# Patient Record
Sex: Female | Born: 1970 | Hispanic: Yes | Marital: Married | Smoking: Never smoker
Health system: Southern US, Community
[De-identification: ages and names within clinical notes are randomized; demographics above are authoritative.]

---

## 2005-08-31 ENCOUNTER — Emergency Department: Payer: Self-pay | Admitting: Unknown Physician Specialty

## 2005-09-19 ENCOUNTER — Emergency Department: Payer: Self-pay | Admitting: General Practice

## 2009-01-18 ENCOUNTER — Emergency Department: Payer: Self-pay | Admitting: Emergency Medicine

## 2009-12-31 ENCOUNTER — Ambulatory Visit: Payer: Self-pay

## 2011-11-01 ENCOUNTER — Emergency Department: Payer: Self-pay | Admitting: Emergency Medicine

## 2012-04-04 LAB — HM PAP SMEAR

## 2013-11-22 ENCOUNTER — Emergency Department: Payer: Self-pay | Admitting: Emergency Medicine

## 2013-11-22 LAB — URINALYSIS, COMPLETE
Bilirubin,UR: NEGATIVE
Glucose,UR: NEGATIVE mg/dL (ref 0–75)
Ketone: NEGATIVE
Nitrite: NEGATIVE
Ph: 7 (ref 4.5–8.0)
Protein: NEGATIVE
Specific Gravity: 1.011 (ref 1.003–1.030)
WBC UR: 2 /HPF (ref 0–5)

## 2014-09-23 ENCOUNTER — Ambulatory Visit: Payer: Self-pay | Admitting: Obstetrics and Gynecology

## 2014-09-23 LAB — BASIC METABOLIC PANEL
Anion Gap: 5 — ABNORMAL LOW (ref 7–16)
BUN: 8 mg/dL (ref 7–18)
CALCIUM: 8.6 mg/dL (ref 8.5–10.1)
CREATININE: 0.59 mg/dL — AB (ref 0.60–1.30)
Chloride: 107 mmol/L (ref 98–107)
Co2: 27 mmol/L (ref 21–32)
EGFR (African American): >60
Glucose: 77 mg/dL (ref 65–99)
OSMOLALITY: 275 (ref 275–301)
Potassium: 4.4 mmol/L (ref 3.5–5.1)
Sodium: 139 mmol/L (ref 136–145)

## 2014-09-23 LAB — HEMOGLOBIN: HGB: 12 g/dL (ref 12.0–16.0)

## 2014-09-26 ENCOUNTER — Ambulatory Visit: Payer: Self-pay | Admitting: Obstetrics and Gynecology

## 2014-09-26 HISTORY — PX: TOTAL ABDOMINAL HYSTERECTOMY: SHX209

## 2014-09-26 HISTORY — PX: ABDOMINAL HYSTERECTOMY: SHX81

## 2014-09-27 LAB — BASIC METABOLIC PANEL
Anion Gap: 7 (ref 7–16)
BUN: 10 mg/dL (ref 7–18)
CALCIUM: 7.3 mg/dL — AB (ref 8.5–10.1)
Chloride: 109 mmol/L — ABNORMAL HIGH (ref 98–107)
Co2: 26 mmol/L (ref 21–32)
Creatinine: 0.73 mg/dL (ref 0.60–1.30)
EGFR (African American): >60
EGFR (Non-African Amer.): >60
Glucose: 92 mg/dL (ref 65–99)
Osmolality: 282 (ref 275–301)
POTASSIUM: 3.4 mmol/L — AB (ref 3.5–5.1)
Sodium: 142 mmol/L (ref 136–145)

## 2014-09-27 LAB — HEMATOCRIT: HCT: 32.9 % — AB (ref 35.0–47.0)

## 2014-09-29 LAB — PATHOLOGY REPORT

## 2015-02-24 ENCOUNTER — Ambulatory Visit: Payer: Self-pay | Admitting: Podiatry

## 2015-02-26 ENCOUNTER — Ambulatory Visit: Payer: Self-pay | Admitting: Obstetrics and Gynecology

## 2015-04-11 NOTE — Op Note (Signed)
PATIENT NAME:  Tara Bishop, Tara L MR#:  [id number] DATE OF BIRTH:  [birth year]  DATE OF PROCEDURE:  09/26/2014  PREOPERATIVE DIAGNOSIS: Menorrhagia unresponsive to conservative therapy.   POSTOPERATIVE DIAGNOSES: 1.  Menorrhagia.  2.  Fitz-Hugh-Curtis syndrome.  3.  Right simple ovarian cyst.   PROCEDURES: 1.  Laparoscopic supracervical hysterectomy.  2.  Bilateral salpingectomy. 3.  Right ovarian cystotomy.  4.  Lysis of adhesions.   SURGEON: Jennell Cornerhomas Kamarie Veno, MD  FIRST ASSISTANT: Jolinda Croakavid Goodman, MD  ANESTHESIA: General endotracheal anesthesia.   INDICATIONS: This is a 44 year old gravida 0, a patient with a long history of menorrhagia unresponsive to conservative therapy, including birth control pills. Endometrial biopsy and saline infusion sonohysterography all within normal limits.   DESCRIPTION OF PROCEDURE: After adequate general endotracheal anesthesia, the patient was placed in the dorsal supine position with the legs in the [place] stirrups. The patient's abdomen, perineum, and vagina were prepped in normal sterile fashion. After a time out was performed, the patient's bladder was catheterized with a Foley yielding clear urine. Speculum was placed in the vagina and the anterior cervix was grasped with a single-tooth tenaculum and a uterine sound was placed into the endometrial cavity and the two were anchored together with Steri-Strips. Gloves were changed and a 12 mm infraumbilical incision was made after injecting with 0.5% Marcaine. The laparoscope was advanced into the abdominal cavity under direct visualization with the Optiview cannula. A second port site left lower quadrant was placed. Again, an 11 mm port was placed 3 cm medial to the left anterior iliac spine and under direct visualization the port was placed. A third port site was placed in the right lower quadrant, again 3 cm medial to the right anterior iliac spine and under direct visualization an 11 mm trocar was  advanced into the abdominal cavity. The patient was placed in Trendelenburg and the left cornea was grasped with atraumatic grasper and the round ligament was then clamped, cauterized, and transected with the Harmonic scalpel. The proximal portion of the left fallopian tube and the utero-ovarian ligaments were clamped, transected with Harmonic scalpel. Serial clamps in the broad ligament were performed. The bladder flap was created and opened with the Harmonic scalpel. The left uterine artery was then identified and cauterized with the Kleppinger. It was then transected with the Harmonic scalpel. The cervix was partially transected at the level of the uterosacral ligaments, from the left side. Attention was then directed to the patient's right round ligament which was clamped and transected with the Harmonic scalpel, and the right proximal portion of fallopian tube and the right utero-ovarian ligament were clamped and transected with the Harmonic scalpel. Again, the right uterine artery was identified after the rest of the bladder flap was opened. The Kleppingers were used to cauterize the right uterine artery and then the artery was transected with the Harmonic scalpel. The rest of the cervix was transected at the level of the uterosacral ligaments. The uterine sound was then removed and ultimately the uterus was transected completely. Good hemostasis was noted. Surgeon then placed a vaginal hand and the endocervical canal was cauterized with the Kleppingers. Good hemostasis was noted. Gloves were changed and each fallopian tube was grasped with atraumatic grasper and were removed with the Harmonic scalpel. These will be sent with the main specimen. The right ovary did have some scar tissue encapsulating the wall of the ovary which the adhesions were taken down with the Harmonic scalpel. Given approximately 5 cm size of the ovary, it  was felt that there was an ovarian cyst within. Harmonic scalpel was used to open  the cyst wall and clear fluid resulted with good collapse of the ovarian cyst. There were some hemosiderin that was noted on the right ovary, nonspecific. The left colon was noted to be slightly scarred to the left sidewall. Harmonic scalpel was used to release these adhesions. Upper abdomen showed perihepatic stranding consistent with Fitz-Hugh-Curtis syndrome. The left lower port site was removed and the morcellator was then advanced into the abdominal cavity under direct visualization. Uterus was morcellated in standard fashion without difficulty. Morcellator was then removed from the left lower port site and the 11 mm trocar was readvanced into the abdominal cavity and the patient's abdomen was copiously irrigated. The ureters had normal peristaltic activity bilaterally. There was good hemostasis after the intra-abdominal pressure was lowered to 7 mmHg and the procedure was then terminated, all port sites removed and the patient's abdomen was deflated. The left lower port site was closed with a fascial layer of 2-0 Vicryl and all skin incisions were closed with interrupted 4-0 Vicryl suture. Dermabond was used on all the incisions and a loose dressing. The single-tooth tenaculum was then removed from the anterior cervix. Good hemostasis was noted. The Foley was left in place. There were no complications. Estimated blood loss 25 mL. Intraoperative fluids 1000 mL. The patient tolerated the procedure well. Of note, she did receive 2 grams IV cefoxitin prior to commencement of the case and she was taken to the recovery room in good condition.  ____________________________ Suzy Bouchard, MD tjs:sb D: 09/26/2014 11:23:25 ET T: 09/26/2014 11:49:53 ET JOB#: [id number]  cc: Suzy Bouchard, MD, <Dictator> Suzy Bouchard MD ELECTRONICALLY SIGNED 09/26/2014 20:28

## 2016-02-16 DIAGNOSIS — N979 Female infertility, unspecified: Secondary | ICD-10-CM | POA: Insufficient documentation

## 2016-02-17 ENCOUNTER — Ambulatory Visit (INDEPENDENT_AMBULATORY_CARE_PROVIDER_SITE_OTHER): Payer: BLUE CROSS/BLUE SHIELD | Admitting: Unknown Physician Specialty

## 2016-02-17 ENCOUNTER — Encounter: Payer: Self-pay | Admitting: Unknown Physician Specialty

## 2016-02-17 VITALS — BP 119/83 | HR 70 | Temp 98.1°F | Ht 60.5 in | Wt 180.8 lb

## 2016-02-17 DIAGNOSIS — Z0000 Encounter for general adult medical examination without abnormal findings: Secondary | ICD-10-CM

## 2016-02-17 NOTE — Progress Notes (Signed)
-    BP 119/83 mmHg  Pulse 70  Temp(Src) 98.1 F (36.7 C)  Ht 5' 0.5" (1.537 m)  Wt 180 lb 12.8 oz (82.01 kg)  BMI 34.72 kg/m2  SpO2 97%  LMP  (LMP Unknown)   Subjective:    Patient ID: Tara Bishop, female    DOB: Apr 21, 1971, 45 y.o.   MRN: [id number]  HPI: Tara Bishop is a 45 y.o. female  Chief Complaint  Patient presents with  . Annual Exam   History reviewed. No pertinent past medical history.  Past Surgical History  Procedure Laterality Date  . Abdominal hysterectomy  09/26/14    partial   Family History  Problem Relation Age of Onset  . Cancer Mother     Relevant past medical, surgical, family and social history reviewed and updated as indicated. Interim medical history since our last visit reviewed. Allergies and medications reviewed and updated.  Review of Systems  Constitutional: Negative.   HENT: Negative.   Eyes: Negative.   Respiratory: Negative.   Cardiovascular: Negative.   Gastrointestinal: Negative.   Endocrine: Negative.   Genitourinary: Negative.   Musculoskeletal: Negative.   Skin: Negative.   Allergic/Immunologic: Negative.   Neurological: Negative.   Hematological: Negative.   Psychiatric/Behavioral: Negative.    Per HPI unless specifically indicated above     Objective:    BP 119/83 mmHg  Pulse 70  Temp(Src) 98.1 F (36.7 C)  Ht 5' 0.5" (1.537 m)  Wt 180 lb 12.8 oz (82.01 kg)  BMI 34.72 kg/m2  SpO2 97%  LMP  (LMP Unknown)  Wt Readings from Last 3 Encounters:  02/17/16 180 lb 12.8 oz (82.01 kg)  03/22/13 179 lb (81.194 kg)    Physical Exam  Constitutional: She is oriented to person, place, and time. She appears well-developed and well-nourished.  HENT:  Head: Normocephalic and atraumatic.  Eyes: Pupils are equal, round, and reactive to light. Right eye exhibits no discharge. Left eye exhibits no discharge. No scleral icterus.  Neck: Normal range of motion. Neck supple. Carotid bruit is not present. No thyromegaly  present.  Cardiovascular: Normal rate, regular rhythm and normal heart sounds.  Exam reveals no gallop and no friction rub.   No murmur heard. Pulmonary/Chest: Effort normal and breath sounds normal. No respiratory distress. She has no wheezes. She has no rales.  Abdominal: Soft. Bowel sounds are normal. There is no tenderness. There is no rebound.  Genitourinary: No breast swelling, tenderness or discharge.  Musculoskeletal: Normal range of motion.  Lymphadenopathy:    She has no cervical adenopathy.  Neurological: She is alert and oriented to person, place, and time.  Skin: Skin is warm, dry and intact. No rash noted.  Psychiatric: She has a normal mood and affect. Her speech is normal and behavior is normal. Judgment and thought content normal. Cognition and memory are normal.      Assessment & Plan:   Problem List Items Addressed This Visit    None    Visit Diagnoses    Annual physical exam    -  Primary    Relevant Orders    CBC with Differential/Platelet    Comprehensive metabolic panel    HIV antibody    TSH    Lipid Panel w/o Chol/HDL Ratio        Follow up plan: No Follow-up on file.

## 2016-02-18 LAB — LIPID PANEL W/O CHOL/HDL RATIO
CHOLESTEROL TOTAL: 177 mg/dL (ref 100–199)
HDL: 41 mg/dL (ref >39)
LDL CALC: 113 mg/dL — AB (ref 0–99)
Triglycerides: 116 mg/dL (ref 0–149)
VLDL CHOLESTEROL CAL: 23 mg/dL (ref 5–40)

## 2016-02-18 LAB — CBC WITH DIFFERENTIAL/PLATELET
Basophils Absolute: 0 10*3/uL (ref 0.0–0.2)
Basos: 1 %
EOS (ABSOLUTE): 0.1 10*3/uL (ref 0.0–0.4)
Eos: 1 %
Hematocrit: 38.7 % (ref 34.0–46.6)
Hemoglobin: 13 g/dL (ref 11.1–15.9)
Immature Grans (Abs): 0 10*3/uL (ref 0.0–0.1)
Immature Granulocytes: 0 %
LYMPHS ABS: 2.5 10*3/uL (ref 0.7–3.1)
Lymphs: 28 %
MCH: 28.5 pg (ref 26.6–33.0)
MCHC: 33.6 g/dL (ref 31.5–35.7)
MCV: 85 fL (ref 79–97)
MONOS ABS: 0.5 10*3/uL (ref 0.1–0.9)
Monocytes: 6 %
Neutrophils Absolute: 5.7 10*3/uL (ref 1.4–7.0)
Neutrophils: 64 %
Platelets: 257 10*3/uL (ref 150–379)
RBC: 4.56 x10E6/uL (ref 3.77–5.28)
RDW: 14.3 % (ref 12.3–15.4)
WBC: 8.8 10*3/uL (ref 3.4–10.8)

## 2016-02-18 LAB — COMPREHENSIVE METABOLIC PANEL
A/G RATIO: 1.9 (ref 1.1–2.5)
ALK PHOS: 84 IU/L (ref 39–117)
ALT: 23 IU/L (ref 0–32)
AST: 19 IU/L (ref 0–40)
Albumin: 4.6 g/dL (ref 3.5–5.5)
BUN/Creatinine Ratio: 13 (ref 9–23)
BUN: 8 mg/dL (ref 6–24)
Bilirubin Total: 0.3 mg/dL (ref 0.0–1.2)
CO2: 25 mmol/L (ref 18–29)
Calcium: 9.3 mg/dL (ref 8.7–10.2)
Chloride: 100 mmol/L (ref 96–106)
Creatinine, Ser: 0.63 mg/dL (ref 0.57–1.00)
GFR calc Af Amer: 128 mL/min/{1.73_m2} (ref >59)
GFR, EST NON AFRICAN AMERICAN: 111 mL/min/{1.73_m2} (ref >59)
GLOBULIN, TOTAL: 2.4 g/dL (ref 1.5–4.5)
Glucose: 94 mg/dL (ref 65–99)
POTASSIUM: 4.7 mmol/L (ref 3.5–5.2)
SODIUM: 139 mmol/L (ref 134–144)
Total Protein: 7 g/dL (ref 6.0–8.5)

## 2016-02-18 LAB — HIV ANTIBODY (ROUTINE TESTING W REFLEX): HIV SCREEN 4TH GENERATION: NONREACTIVE

## 2016-02-18 LAB — TSH: TSH: 1.66 u[IU]/mL (ref 0.450–4.500)

## 2016-02-19 ENCOUNTER — Encounter: Payer: Self-pay | Admitting: Unknown Physician Specialty

## 2016-09-04 ENCOUNTER — Emergency Department
Admission: EM | Admit: 2016-09-04 | Discharge: 2016-09-04 | Disposition: A | Discharge status: Home / Self Care | Payer: BLUE CROSS/BLUE SHIELD | Attending: Emergency Medicine | Admitting: Emergency Medicine

## 2016-09-04 ENCOUNTER — Emergency Department: Payer: BLUE CROSS/BLUE SHIELD

## 2016-09-04 ENCOUNTER — Encounter: Payer: Self-pay | Admitting: Emergency Medicine

## 2016-09-04 DIAGNOSIS — R1032 Left lower quadrant pain: Secondary | ICD-10-CM | POA: Diagnosis present

## 2016-09-04 DIAGNOSIS — R109 Unspecified abdominal pain: Secondary | ICD-10-CM

## 2016-09-04 LAB — CBC
HCT: 41.5 % (ref 35.0–47.0)
Hemoglobin: 14.4 g/dL (ref 12.0–16.0)
MCH: 29.4 pg (ref 26.0–34.0)
MCHC: 34.6 g/dL (ref 32.0–36.0)
MCV: 84.8 fL (ref 80.0–100.0)
Platelets: 237 K/uL (ref 150–440)
RBC: 4.89 MIL/uL (ref 3.80–5.20)
RDW: 14.4 % (ref 11.5–14.5)
WBC: 9.8 K/uL (ref 3.6–11.0)

## 2016-09-04 LAB — COMPREHENSIVE METABOLIC PANEL WITH GFR
ALT: 24 U/L (ref 14–54)
AST: 22 U/L (ref 15–41)
Albumin: 4.6 g/dL (ref 3.5–5.0)
Alkaline Phosphatase: 66 U/L (ref 38–126)
Anion gap: 5 (ref 5–15)
BUN: 11 mg/dL (ref 6–20)
CO2: 29 mmol/L (ref 22–32)
Calcium: 9.1 mg/dL (ref 8.9–10.3)
Chloride: 106 mmol/L (ref 101–111)
Creatinine, Ser: 0.63 mg/dL (ref 0.44–1.00)
GFR calc Af Amer: >60 mL/min (ref >60)
GFR calc non Af Amer: >60 mL/min (ref >60)
Glucose, Bld: 90 mg/dL (ref 65–99)
Potassium: 4 mmol/L (ref 3.5–5.1)
Sodium: 140 mmol/L (ref 135–145)
Total Bilirubin: 0.5 mg/dL (ref 0.3–1.2)
Total Protein: 7.8 g/dL (ref 6.5–8.1)

## 2016-09-04 LAB — URINALYSIS COMPLETE WITH MICROSCOPIC (ARMC ONLY)
Bacteria, UA: NONE SEEN
Bilirubin Urine: NEGATIVE
Glucose, UA: NEGATIVE mg/dL
Hgb urine dipstick: NEGATIVE
Ketones, ur: NEGATIVE mg/dL
Leukocytes, UA: NEGATIVE
Nitrite: NEGATIVE
Protein, ur: NEGATIVE mg/dL
RBC / HPF: NONE SEEN RBC/hpf (ref 0–5)
Specific Gravity, Urine: 1.012 (ref 1.005–1.030)
WBC, UA: NONE SEEN WBC/hpf (ref 0–5)
pH: 6 (ref 5.0–8.0)

## 2016-09-04 LAB — POCT PREGNANCY, URINE: Preg Test, Ur: NEGATIVE

## 2016-09-04 LAB — LIPASE, BLOOD: Lipase: 21 U/L (ref 11–51)

## 2016-09-04 NOTE — ED Provider Notes (Addendum)
[facility] Emergency Department Provider Note  ____________________________________________   I have reviewed the triage vital signs and the nursing notes.   HISTORY  Chief Complaint Abdominal Pain    HPI Tara Bishop is a 45 y.o. female who does have primary care. Patient states that she had a partial hysterectomy several years ago. For the last month or so she has been having left lower quadrant abdominal discomfort which comes and goes.She denies any fever chills nausea or vomiting. She has no dysuria no urinary frequency. Sometimes it goes towards the left flank. She has never had kidney stones. He does have a history of ovarian cysts remotely. History is per translator and my Spanish. Pain is somewhat similar to prior ovarian cyst. Patient denies any vaginal discharge, but really has no other symptoms aside from this discomfort. Nothing makes it better nothing makes it worse aside from walking around sometimes     History reviewed. No pertinent past medical history.  There are no active problems to display for this patient.   Past Surgical History:  Procedure Laterality Date  . ABDOMINAL HYSTERECTOMY  09/26/14   partial    Prior to Admission medications   Not on File    Allergies Review of patient's allergies indicates no known allergies.  Family History  Problem Relation Age of Onset  . Cancer Mother     Social History Social History  Substance Use Topics  . Smoking status: Never Smoker  . Smokeless tobacco: Never Used  . Alcohol use No    Review of Systems Constitutional: No fever/chills Eyes: No visual changes. ENT: No sore throat. No stiff neck no neck pain Cardiovascular: Denies chest pain. Respiratory: Denies shortness of breath. Gastrointestinal:   no vomiting.  No diarrhea.  No constipation. Genitourinary: Negative for dysuria. Musculoskeletal: Negative lower extremity swelling Skin: Negative for  rash. Neurological: Negative for severe headaches, focal weakness or numbness. 10-point ROS otherwise negative.  ____________________________________________   PHYSICAL EXAM:  VITAL SIGNS: ED Triage Vitals  Enc Vitals Group     BP 09/04/16 1451 130/83     Pulse Rate 09/04/16 1451 (!) 53     Resp 09/04/16 1451 16     Temp 09/04/16 1451 98.3 F (36.8 C)     Temp Source 09/04/16 1451 Oral     SpO2 09/04/16 1451 98 %     Weight 09/04/16 1453 180 lb (81.6 kg)     Height 09/04/16 1453 5\' 2"  (1.575 m)     Head Circumference --      Peak Flow --      Pain Score 09/04/16 1454 9     Pain Loc --      Pain Edu? --      Excl. in GC? --     Constitutional: Alert and oriented. Well appearing and in no acute distress. Eyes: Conjunctivae are normal. PERRL. EOMI. Head: Atraumatic. Nose: No congestion/rhinnorhea. Mouth/Throat: Mucous membranes are moist.  Oropharynx non-erythematous. Neck: No stridor.   Nontender with no meningismus Cardiovascular: Normal rate, regular rhythm. Grossly normal heart sounds.  Good peripheral circulation. Respiratory: Normal respiratory effort.  No retractions. Lungs CTAB. Abdominal: Soft and Minimal tenders palpations specific spot on the left lower quadrant. Obesity limits the exam somewhat but there is no evidence of obvious hernia mass or infection. No distention. No guarding no rebound no peritoneal signs Back:  There is no focal tenderness or step off.  there is no midline tenderness there are no lesions noted. there  is no CVA tenderness Musculoskeletal: No lower extremity tenderness, no upper extremity tenderness. No joint effusions, no DVT signs strong distal pulses no edema Neurologic:  Normal speech and language. No gross focal neurologic deficits are appreciated.  Skin:  Skin is warm, dry and intact. No rash noted. Psychiatric: Mood and affect are normal. Speech and behavior are normal.  ____________________________________________   LABS (all labs  ordered are listed, but only abnormal results are displayed)  Labs Reviewed  URINALYSIS COMPLETEWITH MICROSCOPIC ([facility] ONLY) - Abnormal; Notable for the following:       Result Value   Color, Urine YELLOW (*)    APPearance CLEAR (*)    Squamous Epithelial / LPF 0-5 (*)    All other components within normal limits  LIPASE, BLOOD  COMPREHENSIVE METABOLIC PANEL  CBC   ____________________________________________  EKG  I personally interpreted any EKGs ordered by me or triage  ____________________________________________  RADIOLOGY  I reviewed any imaging ordered by me or triage that were performed during my shift and, if possible, patient and/or family made aware of any abnormal findings. ____________________________________________   PROCEDURES  Procedure(s) performed: None  Procedures  Critical Care performed: None  ____________________________________________   INITIAL IMPRESSION / ASSESSMENT AND PLAN / ED COURSE  Pertinent labs & imaging results that were available during my care of the patient were reviewed by me and considered in my medical decision making (see chart for details).  Very atypical left-sided abdominal pain differential does include diverticulitis kidney stone and ovarian cyst given history of ovarian cysts and the fact that she has ovaries still we did do an ultrasound. That is negative for any likely contributing pathology there is an ovarian cyst on the right. Obesity somewhat limited exam with 3 weeks of discomfort with no acute findings and quite reassured. However, a obstructing kidney stone could certainly cause similar symptoms as he can, less likely, diverticulitis. I will obtain CT scan this patient states she has had great difficulty getting in to see her primary care provider secondary to language barrier. Internal hernia is also possible Given obesity.  Clinical Course   ____________________________________________   FINAL CLINICAL  IMPRESSION(S) / ED DIAGNOSES  Final diagnoses:  Abdominal pain      This chart was dictated using voice recognition software.  Despite best efforts to proofread,  errors can occur which can change meaning.      Jeanmarie Plant, MD 09/04/16 1727    Jeanmarie Plant, MD 09/04/16 [phone]

## 2016-09-04 NOTE — ED Notes (Signed)
AAOx3.  Skin warm and dry. Posture upright and relaxed.  NAD 

## 2017-02-27 ENCOUNTER — Encounter: Payer: BLUE CROSS/BLUE SHIELD | Admitting: Unknown Physician Specialty

## 2017-03-17 ENCOUNTER — Encounter: Payer: Self-pay | Admitting: Unknown Physician Specialty

## 2017-03-17 ENCOUNTER — Ambulatory Visit (INDEPENDENT_AMBULATORY_CARE_PROVIDER_SITE_OTHER): Payer: BLUE CROSS/BLUE SHIELD | Admitting: Unknown Physician Specialty

## 2017-03-17 VITALS — BP 119/85 | HR 65 | Temp 98.3°F | Ht 60.7 in | Wt 185.3 lb

## 2017-03-17 DIAGNOSIS — E6609 Other obesity due to excess calories: Secondary | ICD-10-CM | POA: Diagnosis not present

## 2017-03-17 DIAGNOSIS — Z6835 Body mass index (BMI) 35.0-35.9, adult: Secondary | ICD-10-CM | POA: Diagnosis not present

## 2017-03-17 DIAGNOSIS — Z0000 Encounter for general adult medical examination without abnormal findings: Secondary | ICD-10-CM | POA: Diagnosis not present

## 2017-03-17 NOTE — Progress Notes (Signed)
BP 119/85 (BP Location: Left Arm, Patient Position: Sitting, Cuff Size: Normal)   Pulse 65   Temp 98.3 F (36.8 C)   Ht 5' 0.7" (1.542 m)   Wt 185 lb 4.8 oz (84.1 kg)   LMP  (LMP Unknown)   SpO2 97%   BMI 35.36 kg/m    Subjective:    Patient ID: Tara Bishop, female    DOB: 17-Mar-1971, 46 y.o.   MRN: [id number]  HPI: Tara Bishop is a 46 y.o. female  Chief Complaint  Patient presents with  . Annual Exam   Pt is here with her husband who is doing some of the interpreting.      Headache States she is having a headache for 2 weeks.  States pain is gone for the past 3 days.     Social History   Social History  . Marital status: Married    Spouse name: N/A  . Number of children: N/A  . Years of education: N/A   Occupational History  . Not on file.   Social History Main Topics  . Smoking status: Never Smoker  . Smokeless tobacco: Never Used  . Alcohol use No  . Drug use: No  . Sexual activity: Yes   Other Topics Concern  . Not on file   Social History Narrative  . No narrative on file   Family History  Problem Relation Age of Onset  . Cancer Mother    History reviewed. No pertinent past medical history.  Past Surgical History:  Procedure Laterality Date  . ABDOMINAL HYSTERECTOMY  09/26/14   partial    Relevant past medical, surgical, family and social history reviewed and updated as indicated. Interim medical history since our last visit reviewed. Allergies and medications reviewed and updated.  Review of Systems  Per HPI unless specifically indicated above     Objective:    BP 119/85 (BP Location: Left Arm, Patient Position: Sitting, Cuff Size: Normal)   Pulse 65   Temp 98.3 F (36.8 C)   Ht 5' 0.7" (1.542 m)   Wt 185 lb 4.8 oz (84.1 kg)   LMP  (LMP Unknown)   SpO2 97%   BMI 35.36 kg/m   Wt Readings from Last 3 Encounters:  03/17/17 185 lb 4.8 oz (84.1 kg)  09/04/16 180 lb (81.6 kg)  02/17/16 180 lb 12.8 oz (82 kg)      Physical Exam  Constitutional: She is oriented to person, place, and time. She appears well-developed and well-nourished.  HENT:  Head: Normocephalic and atraumatic.  Eyes: Pupils are equal, round, and reactive to light. Right eye exhibits no discharge. Left eye exhibits no discharge. No scleral icterus.  Neck: Normal range of motion. Neck supple. Carotid bruit is not present. No thyromegaly present.  Cardiovascular: Normal rate, regular rhythm and normal heart sounds.  Exam reveals no gallop and no friction rub.   No murmur heard. Pulmonary/Chest: Effort normal and breath sounds normal. No respiratory distress. She has no wheezes. She has no rales.  Abdominal: Soft. Bowel sounds are normal. There is no tenderness. There is no rebound.  Genitourinary: No breast swelling, tenderness or discharge.  Musculoskeletal: Normal range of motion.  Lymphadenopathy:    She has no cervical adenopathy.  Neurological: She is alert and oriented to person, place, and time.  Skin: Skin is warm, dry and intact. No rash noted.  Psychiatric: She has a normal mood and affect. Her speech is normal and behavior is normal. Judgment  and thought content normal. Cognition and memory are normal.    Results for orders placed or performed during the hospital encounter of 09/04/16  Lipase, blood  Result Value Ref Range   Lipase 21 11 - 51 U/L  Comprehensive metabolic panel  Result Value Ref Range   Sodium 140 135 - 145 mmol/L   Potassium 4.0 3.5 - 5.1 mmol/L   Chloride 106 101 - 111 mmol/L   CO2 29 22 - 32 mmol/L   Glucose, Bld 90 65 - 99 mg/dL   BUN 11 6 - 20 mg/dL   Creatinine, Ser 1.61 0.44 - 1.00 mg/dL   Calcium 9.1 8.9 - 09.6 mg/dL   Total Protein 7.8 6.5 - 8.1 g/dL   Albumin 4.6 3.5 - 5.0 g/dL   AST 22 15 - 41 U/L   ALT 24 14 - 54 U/L   Alkaline Phosphatase 66 38 - 126 U/L   Total Bilirubin 0.5 0.3 - 1.2 mg/dL   GFR calc non Af Amer >60 >60 mL/min   GFR calc Af Amer >60 >60 mL/min   Anion gap 5 5 -  15  CBC  Result Value Ref Range   WBC 9.8 3.6 - 11.0 K/uL   RBC 4.89 3.80 - 5.20 MIL/uL   Hemoglobin 14.4 12.0 - 16.0 g/dL   HCT 04.5 40.9 - 81.1 %   MCV 84.8 80.0 - 100.0 fL   MCH 29.4 26.0 - 34.0 pg   MCHC 34.6 32.0 - 36.0 g/dL   RDW 91.4 78.2 - 95.6 %   Platelets 237 150 - 440 K/uL  Urinalysis complete, with microscopic  Result Value Ref Range   Color, Urine YELLOW (A) YELLOW   APPearance CLEAR (A) CLEAR   Glucose, UA NEGATIVE NEGATIVE mg/dL   Bilirubin Urine NEGATIVE NEGATIVE   Ketones, ur NEGATIVE NEGATIVE mg/dL   Specific Gravity, Urine 1.012 1.005 - 1.030   Hgb urine dipstick NEGATIVE NEGATIVE   pH 6.0 5.0 - 8.0   Protein, ur NEGATIVE NEGATIVE mg/dL   Nitrite NEGATIVE NEGATIVE   Leukocytes, UA NEGATIVE NEGATIVE   RBC / HPF NONE SEEN 0 - 5 RBC/hpf   WBC, UA NONE SEEN 0 - 5 WBC/hpf   Bacteria, UA NONE SEEN NONE SEEN   Squamous Epithelial / LPF 0-5 (A) NONE SEEN   Mucous PRESENT   Pregnancy, urine POC  Result Value Ref Range   Preg Test, Ur NEGATIVE NEGATIVE      Assessment & Plan:   Problem List Items Addressed This Visit    None    Visit Diagnoses    Annual physical exam    -  Primary   Class 2 obesity due to excess calories without serious comorbidity with body mass index (BMI) of 35.0 to 35.9 in adult           Follow up plan: Return in about 1 year (around 03/17/2018).

## 2017-03-18 LAB — CBC WITH DIFFERENTIAL/PLATELET
BASOS ABS: 0 10*3/uL (ref 0.0–0.2)
Basos: 0 %
EOS (ABSOLUTE): 0.1 10*3/uL (ref 0.0–0.4)
Eos: 1 %
HEMOGLOBIN: 12.4 g/dL (ref 11.1–15.9)
Hematocrit: 38.5 % (ref 34.0–46.6)
IMMATURE GRANULOCYTES: 0 %
Immature Grans (Abs): 0 10*3/uL (ref 0.0–0.1)
LYMPHS: 26 %
Lymphocytes Absolute: 2.6 10*3/uL (ref 0.7–3.1)
MCH: 28.4 pg (ref 26.6–33.0)
MCHC: 32.2 g/dL (ref 31.5–35.7)
MCV: 88 fL (ref 79–97)
Monocytes Absolute: 0.7 10*3/uL (ref 0.1–0.9)
Monocytes: 6 %
NEUTROS ABS: 6.8 10*3/uL (ref 1.4–7.0)
Neutrophils: 67 %
Platelets: 254 10*3/uL (ref 150–379)
RBC: 4.37 x10E6/uL (ref 3.77–5.28)
RDW: 14.2 % (ref 12.3–15.4)
WBC: 10.2 10*3/uL (ref 3.4–10.8)

## 2017-03-18 LAB — TSH: TSH: 1.79 u[IU]/mL (ref 0.450–4.500)

## 2017-03-18 LAB — COMPREHENSIVE METABOLIC PANEL
ALT: 25 IU/L (ref 0–32)
AST: 19 IU/L (ref 0–40)
Albumin/Globulin Ratio: 1.7 (ref 1.2–2.2)
Albumin: 4.1 g/dL (ref 3.5–5.5)
Alkaline Phosphatase: 79 IU/L (ref 39–117)
BILIRUBIN TOTAL: 0.2 mg/dL (ref 0.0–1.2)
BUN/Creatinine Ratio: 23 (ref 9–23)
BUN: 13 mg/dL (ref 6–24)
CHLORIDE: 101 mmol/L (ref 96–106)
CO2: 23 mmol/L (ref 18–29)
Calcium: 9 mg/dL (ref 8.7–10.2)
Creatinine, Ser: 0.56 mg/dL — ABNORMAL LOW (ref 0.57–1.00)
GFR calc non Af Amer: 115 mL/min/{1.73_m2} (ref >59)
GFR, EST AFRICAN AMERICAN: 132 mL/min/{1.73_m2} (ref >59)
Globulin, Total: 2.4 g/dL (ref 1.5–4.5)
Glucose: 87 mg/dL (ref 65–99)
Potassium: 4.2 mmol/L (ref 3.5–5.2)
Sodium: 141 mmol/L (ref 134–144)
TOTAL PROTEIN: 6.5 g/dL (ref 6.0–8.5)

## 2017-03-18 LAB — LIPID PANEL W/O CHOL/HDL RATIO
CHOLESTEROL TOTAL: 178 mg/dL (ref 100–199)
HDL: 45 mg/dL (ref >39)
LDL CALC: 97 mg/dL (ref 0–99)
TRIGLYCERIDES: 182 mg/dL — AB (ref 0–149)
VLDL CHOLESTEROL CAL: 36 mg/dL (ref 5–40)

## 2017-03-18 LAB — HGB A1C W/O EAG: Hgb A1c MFr Bld: 6.3 % — ABNORMAL HIGH (ref 4.8–5.6)

## 2017-03-21 ENCOUNTER — Encounter: Payer: Self-pay | Admitting: Unknown Physician Specialty

## 2017-09-18 ENCOUNTER — Ambulatory Visit: Payer: BLUE CROSS/BLUE SHIELD | Admitting: Unknown Physician Specialty

## 2018-04-06 ENCOUNTER — Ambulatory Visit (INDEPENDENT_AMBULATORY_CARE_PROVIDER_SITE_OTHER): Payer: BLUE CROSS/BLUE SHIELD | Admitting: Unknown Physician Specialty

## 2018-04-06 ENCOUNTER — Encounter: Payer: Self-pay | Admitting: Unknown Physician Specialty

## 2018-04-06 VITALS — BP 131/79 | HR 61 | Temp 98.4°F | Ht 59.7 in | Wt 182.3 lb

## 2018-04-06 DIAGNOSIS — Z0000 Encounter for general adult medical examination without abnormal findings: Secondary | ICD-10-CM

## 2018-04-06 NOTE — Patient Instructions (Signed)
Preventive Care 40-64 Years, Female Preventive care refers to lifestyle choices and visits with your health care provider that can promote health and wellness. What does preventive care include?  A yearly physical exam. This is also called an annual well check.  Dental exams once or twice a year.  Routine eye exams. Ask your health care provider how often you should have your eyes checked.  Personal lifestyle choices, including: ? Daily care of your teeth and gums. ? Regular physical activity. ? Eating a healthy diet. ? Avoiding tobacco and drug use. ? Limiting alcohol use. ? Practicing safe sex. ? Taking low-dose aspirin daily starting at age 45. ? Taking vitamin and mineral supplements as recommended by your health care provider. What happens during an annual well check? The services and screenings done by your health care provider during your annual well check will depend on your age, overall health, lifestyle risk factors, and family history of disease. Counseling Your health care provider may ask you questions about your:  Alcohol use.  Tobacco use.  Drug use.  Emotional well-being.  Home and relationship well-being.  Sexual activity.  Eating habits.  Work and work Statistician.  Method of birth control.  Menstrual cycle.  Pregnancy history.  Screening You may have the following tests or measurements:  Height, weight, and BMI.  Blood pressure.  Lipid and cholesterol levels. These may be checked every 5 years, or more frequently if you are over 45 years old.  Skin check.  Lung cancer screening. You may have this screening every year starting at age 45 if you have a 30-pack-year history of smoking and currently smoke or have quit within the past 15 years.  Fecal occult blood test (FOBT) of the stool. You may have this test every year starting at age 45.  Flexible sigmoidoscopy or colonoscopy. You may have a sigmoidoscopy every 5 years or a colonoscopy  every 10 years starting at age 45.  Hepatitis C blood test.  Hepatitis B blood test.  Sexually transmitted disease (STD) testing.  Diabetes screening. This is done by checking your blood sugar (glucose) after you have not eaten for a while (fasting). You may have this done every 1-3 years.  Mammogram. This may be done every 1-2 years. Talk to your health care provider about when you should start having regular mammograms. This may depend on whether you have a family history of breast cancer.  BRCA-related cancer screening. This may be done if you have a family history of breast, ovarian, tubal, or peritoneal cancers.  Pelvic exam and Pap test. This may be done every 3 years starting at age 45. Starting at age 45, this may be done every 5 years if you have a Pap test in combination with an HPV test.  Bone density scan. This is done to screen for osteoporosis. You may have this scan if you are at high risk for osteoporosis.  Discuss your test results, treatment options, and if necessary, the need for more tests with your health care provider. Vaccines Your health care provider may recommend certain vaccines, such as:  Influenza vaccine. This is recommended every year.  Tetanus, diphtheria, and acellular pertussis (Tdap, Td) vaccine. You may need a Td booster every 10 years.  Varicella vaccine. You may need this if you have not been vaccinated.  Zoster vaccine. You may need this after age 45.  Measles, mumps, and rubella (MMR) vaccine. You may need at least one dose of MMR if you were born in  1957 or later. You may also need a second dose.  Pneumococcal 13-valent conjugate (PCV13) vaccine. You may need this if you have certain conditions and were not previously vaccinated.  Pneumococcal polysaccharide (PPSV23) vaccine. You may need one or two doses if you smoke cigarettes or if you have certain conditions.  Meningococcal vaccine. You may need this if you have certain  conditions.  Hepatitis A vaccine. You may need this if you have certain conditions or if you travel or work in places where you may be exposed to hepatitis A.  Hepatitis B vaccine. You may need this if you have certain conditions or if you travel or work in places where you may be exposed to hepatitis B.  Haemophilus influenzae type b (Hib) vaccine. You may need this if you have certain conditions.  Talk to your health care provider about which screenings and vaccines you need and how often you need them. This information is not intended to replace advice given to you by your health care provider. Make sure you discuss any questions you have with your health care provider. Document Released: 01/01/2016 Document Revised: 08/24/2016 Document Reviewed: 10/06/2015 Elsevier Interactive Patient Education  2018 Elsevier Inc.  

## 2018-04-06 NOTE — Progress Notes (Signed)
BP 131/79 (BP Location: Left Arm, Cuff Size: Normal)   Pulse 61   Temp 98.4 F (36.9 C) (Oral)   Ht 4' 11.7" (1.516 m)   Wt 182 lb 4.8 oz (82.7 kg)   LMP  (LMP Unknown)   SpO2 98%   BMI 35.96 kg/m    Subjective:    Patient ID: Tara Bishop, female    DOB: 18-Apr-1971, 47 y.o.   MRN: [id number]  HPI: Tara Bishop is a 47 y.o. female  Chief Complaint  Patient presents with  . Annual Exam   Social History   Socioeconomic History  . Marital status: Married    Spouse name: Not on file  . Number of children: Not on file  . Years of education: Not on file  . Highest education level: Not on file  Occupational History  . Not on file  Social Needs  . Financial resource strain: Not on file  . Food insecurity:    Worry: Not on file    Inability: Not on file  . Transportation needs:    Medical: Not on file    Non-medical: Not on file  Tobacco Use  . Smoking status: Never Smoker  . Smokeless tobacco: Never Used  Substance and Sexual Activity  . Alcohol use: No    Alcohol/week: 0.0 oz  . Drug use: No  . Sexual activity: Yes  Lifestyle  . Physical activity:    Days per week: Not on file    Minutes per session: Not on file  . Stress: Not on file  Relationships  . Social connections:    Talks on phone: Not on file    Gets together: Not on file    Attends religious service: Not on file    Active member of club or organization: Not on file    Attends meetings of clubs or organizations: Not on file    Relationship status: Not on file  . Intimate partner violence:    Fear of current or ex partner: Not on file    Emotionally abused: Not on file    Physically abused: Not on file    Forced sexual activity: Not on file  Other Topics Concern  . Not on file  Social History Narrative  . Not on file   Family History  Problem Relation Age of Onset  . Cancer Mother    History reviewed. No pertinent past medical history.     Relevant past medical, surgical,  family and social history reviewed and updated as indicated. Interim medical history since our last visit reviewed. Allergies and medications reviewed and updated.  Review of Systems  Constitutional: Negative.   HENT: Negative.   Eyes: Negative.   Respiratory: Negative.   Cardiovascular: Negative.   Gastrointestinal: Negative.   Endocrine: Negative.   Genitourinary: Negative.   Musculoskeletal: Negative.   Skin: Negative.   Allergic/Immunologic: Negative.   Neurological: Negative.   Hematological: Negative.   Psychiatric/Behavioral: Negative.     Per HPI unless specifically indicated above     Objective:    BP 131/79 (BP Location: Left Arm, Cuff Size: Normal)   Pulse 61   Temp 98.4 F (36.9 C) (Oral)   Ht 4' 11.7" (1.516 m)   Wt 182 lb 4.8 oz (82.7 kg)   LMP  (LMP Unknown)   SpO2 98%   BMI 35.96 kg/m   Wt Readings from Last 3 Encounters:  04/06/18 182 lb 4.8 oz (82.7 kg)  03/17/17 185 lb 4.8 oz (  84.1 kg)  09/04/16 180 lb (81.6 kg)    Physical Exam  Constitutional: She is oriented to person, place, and time. She appears well-developed and well-nourished.  HENT:  Head: Normocephalic and atraumatic.  Eyes: Pupils are equal, round, and reactive to light. Right eye exhibits no discharge. Left eye exhibits no discharge. No scleral icterus.  Neck: Normal range of motion. Neck supple. Carotid bruit is not present. No thyromegaly present.  Cardiovascular: Normal rate, regular rhythm and normal heart sounds. Exam reveals no gallop and no friction rub.  No murmur heard. Pulmonary/Chest: Effort normal and breath sounds normal. No respiratory distress. She has no wheezes. She has no rales. No breast tenderness or discharge.  Abdominal: Soft. Bowel sounds are normal. There is no tenderness. There is no rebound.  Genitourinary: No breast tenderness or discharge.  Musculoskeletal: Normal range of motion.  Lymphadenopathy:    She has no cervical adenopathy.  Neurological: She is  alert and oriented to person, place, and time.  Skin: Skin is warm, dry and intact. No rash noted.  Psychiatric: She has a normal mood and affect. Her speech is normal and behavior is normal. Judgment and thought content normal. Cognition and memory are normal.    Results for orders placed or performed in visit on 03/17/17  Comprehensive metabolic panel  Result Value Ref Range   Glucose 87 65 - 99 mg/dL   BUN 13 6 - 24 mg/dL   Creatinine, Ser 1.610.56 (L) 0.57 - 1.00 mg/dL   GFR calc non Af Amer 115 >59 mL/min/1.73   GFR calc Af Amer 132 >59 mL/min/1.73   BUN/Creatinine Ratio 23 9 - 23   Sodium 141 134 - 144 mmol/L   Potassium 4.2 3.5 - 5.2 mmol/L   Chloride 101 96 - 106 mmol/L   CO2 23 18 - 29 mmol/L   Calcium 9.0 8.7 - 10.2 mg/dL   Total Protein 6.5 6.0 - 8.5 g/dL   Albumin 4.1 3.5 - 5.5 g/dL   Globulin, Total 2.4 1.5 - 4.5 g/dL   Albumin/Globulin Ratio 1.7 1.2 - 2.2   Bilirubin Total 0.2 0.0 - 1.2 mg/dL   Alkaline Phosphatase 79 39 - 117 IU/L   AST 19 0 - 40 IU/L   ALT 25 0 - 32 IU/L  CBC with Differential/Platelet  Result Value Ref Range   WBC 10.2 3.4 - 10.8 x10E3/uL   RBC 4.37 3.77 - 5.28 x10E6/uL   Hemoglobin 12.4 11.1 - 15.9 g/dL   Hematocrit 09.638.5 04.534.0 - 46.6 %   MCV 88 79 - 97 fL   MCH 28.4 26.6 - 33.0 pg   MCHC 32.2 31.5 - 35.7 g/dL   RDW 40.914.2 81.112.3 - 91.415.4 %   Platelets 254 150 - 379 x10E3/uL   Neutrophils 67 Not Estab. %   Lymphs 26 Not Estab. %   Monocytes 6 Not Estab. %   Eos 1 Not Estab. %   Basos 0 Not Estab. %   Neutrophils Absolute 6.8 1.4 - 7.0 x10E3/uL   Lymphocytes Absolute 2.6 0.7 - 3.1 x10E3/uL   Monocytes Absolute 0.7 0.1 - 0.9 x10E3/uL   EOS (ABSOLUTE) 0.1 0.0 - 0.4 x10E3/uL   Basophils Absolute 0.0 0.0 - 0.2 x10E3/uL   Immature Granulocytes 0 Not Estab. %   Immature Grans (Abs) 0.0 0.0 - 0.1 x10E3/uL  Lipid Panel w/o Chol/HDL Ratio  Result Value Ref Range   Cholesterol, Total 178 100 - 199 mg/dL   Triglycerides 782182 (H) 0 - 149 mg/dL  HDL 45  >39 mg/dL   VLDL Cholesterol Cal 36 5 - 40 mg/dL   LDL Calculated 97 0 - 99 mg/dL  TSH  Result Value Ref Range   TSH 1.790 0.450 - 4.500 uIU/mL  Hgb A1c w/o eAG  Result Value Ref Range   Hgb A1c MFr Bld 6.3 (H) 4.8 - 5.6 %      Assessment & Plan:   Problem List Items Addressed This Visit    None    Visit Diagnoses    Routine general medical examination at a health care facility    -  Primary   Relevant Orders   CBC with Differential/Platelet   Comprehensive metabolic panel   Lipid Panel w/o Chol/HDL Ratio   TSH      HM: Refused tetnus shot Pap N/A  Follow up plan: Return in about 1 year (around 04/07/2019).

## 2018-04-07 LAB — COMPREHENSIVE METABOLIC PANEL
ALT: 27 IU/L (ref 0–32)
AST: 23 IU/L (ref 0–40)
Albumin/Globulin Ratio: 1.8 (ref 1.2–2.2)
Albumin: 4.4 g/dL (ref 3.5–5.5)
Alkaline Phosphatase: 75 IU/L (ref 39–117)
BILIRUBIN TOTAL: 0.3 mg/dL (ref 0.0–1.2)
BUN / CREAT RATIO: 16 (ref 9–23)
BUN: 9 mg/dL (ref 6–24)
CALCIUM: 9.4 mg/dL (ref 8.7–10.2)
CHLORIDE: 101 mmol/L (ref 96–106)
CO2: 24 mmol/L (ref 20–29)
Creatinine, Ser: 0.56 mg/dL — ABNORMAL LOW (ref 0.57–1.00)
GFR, EST AFRICAN AMERICAN: 130 mL/min/{1.73_m2} (ref >59)
GFR, EST NON AFRICAN AMERICAN: 113 mL/min/{1.73_m2} (ref >59)
GLUCOSE: 89 mg/dL (ref 65–99)
Globulin, Total: 2.5 g/dL (ref 1.5–4.5)
Potassium: 4.3 mmol/L (ref 3.5–5.2)
Sodium: 139 mmol/L (ref 134–144)
Total Protein: 6.9 g/dL (ref 6.0–8.5)

## 2018-04-07 LAB — CBC WITH DIFFERENTIAL/PLATELET
BASOS ABS: 0 10*3/uL (ref 0.0–0.2)
BASOS: 0 %
EOS (ABSOLUTE): 0.2 10*3/uL (ref 0.0–0.4)
Eos: 2 %
HEMOGLOBIN: 13.1 g/dL (ref 11.1–15.9)
Hematocrit: 38.6 % (ref 34.0–46.6)
Immature Grans (Abs): 0 10*3/uL (ref 0.0–0.1)
Immature Granulocytes: 0 %
LYMPHS: 32 %
Lymphocytes Absolute: 3.3 10*3/uL — ABNORMAL HIGH (ref 0.7–3.1)
MCH: 29.4 pg (ref 26.6–33.0)
MCHC: 33.9 g/dL (ref 31.5–35.7)
MCV: 87 fL (ref 79–97)
MONOCYTES: 5 %
Monocytes Absolute: 0.5 10*3/uL (ref 0.1–0.9)
NEUTROS ABS: 6.2 10*3/uL (ref 1.4–7.0)
Neutrophils: 61 %
Platelets: 247 10*3/uL (ref 150–379)
RBC: 4.45 x10E6/uL (ref 3.77–5.28)
RDW: 14.5 % (ref 12.3–15.4)
WBC: 10.2 10*3/uL (ref 3.4–10.8)

## 2018-04-07 LAB — LIPID PANEL W/O CHOL/HDL RATIO
Cholesterol, Total: 167 mg/dL (ref 100–199)
HDL: 46 mg/dL (ref >39)
LDL CALC: 98 mg/dL (ref 0–99)
Triglycerides: 117 mg/dL (ref 0–149)
VLDL CHOLESTEROL CAL: 23 mg/dL (ref 5–40)

## 2018-04-07 LAB — TSH: TSH: 1.79 u[IU]/mL (ref 0.450–4.500)

## 2018-04-09 ENCOUNTER — Encounter: Payer: Self-pay | Admitting: Unknown Physician Specialty

## 2019-04-08 ENCOUNTER — Encounter: Payer: BLUE CROSS/BLUE SHIELD | Admitting: Unknown Physician Specialty

## 2019-04-08 ENCOUNTER — Encounter: Payer: Self-pay | Admitting: Nurse Practitioner

## 2019-10-31 ENCOUNTER — Other Ambulatory Visit: Payer: Self-pay

## 2019-10-31 ENCOUNTER — Emergency Department
Admission: EM | Admit: 2019-10-31 | Discharge: 2019-10-31 | Disposition: A | Discharge status: Home / Self Care | Payer: BC Managed Care – PPO | Attending: Emergency Medicine | Admitting: Emergency Medicine

## 2019-10-31 ENCOUNTER — Encounter: Payer: Self-pay | Admitting: Emergency Medicine

## 2019-10-31 DIAGNOSIS — R519 Headache, unspecified: Secondary | ICD-10-CM | POA: Diagnosis present

## 2019-10-31 DIAGNOSIS — Z5321 Procedure and treatment not carried out due to patient leaving prior to being seen by health care provider: Secondary | ICD-10-CM | POA: Insufficient documentation

## 2019-10-31 NOTE — ED Triage Notes (Signed)
Pt called from [facility] to treatment room, no response 

## 2019-10-31 NOTE — ED Triage Notes (Signed)
Pt in via POV, reports frontal headache x 8 days, denies any accompanying symptoms, denies hx migraines.  BP elevated in triage.  NAD noted at this time.

## 2019-10-31 NOTE — ED Notes (Signed)
Called No answer.

## 2019-10-31 NOTE — ED Notes (Signed)
Called   No answer in lobby  

## 2019-10-31 NOTE — ED Triage Notes (Signed)
Attempted to call pt's cell phone, no response

## 2020-02-25 ENCOUNTER — Encounter: Payer: BLUE CROSS/BLUE SHIELD | Admitting: Nurse Practitioner

## 2020-06-08 ENCOUNTER — Ambulatory Visit (INDEPENDENT_AMBULATORY_CARE_PROVIDER_SITE_OTHER): Payer: BC Managed Care – PPO | Admitting: Nurse Practitioner

## 2020-06-08 ENCOUNTER — Encounter: Payer: Self-pay | Admitting: Nurse Practitioner

## 2020-06-08 ENCOUNTER — Other Ambulatory Visit: Payer: Self-pay

## 2020-06-08 VITALS — BP 132/88 | HR 75 | Temp 98.6°F | Ht 60.0 in | Wt 187.0 lb

## 2020-06-08 DIAGNOSIS — Z0000 Encounter for general adult medical examination without abnormal findings: Secondary | ICD-10-CM | POA: Diagnosis not present

## 2020-06-08 DIAGNOSIS — Z6836 Body mass index (BMI) 36.0-36.9, adult: Secondary | ICD-10-CM

## 2020-06-08 DIAGNOSIS — Z23 Encounter for immunization: Secondary | ICD-10-CM | POA: Diagnosis not present

## 2020-06-08 DIAGNOSIS — R7989 Other specified abnormal findings of blood chemistry: Secondary | ICD-10-CM

## 2020-06-08 DIAGNOSIS — Z13220 Encounter for screening for lipoid disorders: Secondary | ICD-10-CM | POA: Diagnosis not present

## 2020-06-08 DIAGNOSIS — D729 Disorder of white blood cells, unspecified: Secondary | ICD-10-CM | POA: Diagnosis not present

## 2020-06-08 DIAGNOSIS — Z1159 Encounter for screening for other viral diseases: Secondary | ICD-10-CM

## 2020-06-08 DIAGNOSIS — M25562 Pain in left knee: Secondary | ICD-10-CM | POA: Diagnosis not present

## 2020-06-08 DIAGNOSIS — Z1329 Encounter for screening for other suspected endocrine disorder: Secondary | ICD-10-CM

## 2020-06-08 HISTORY — DX: Pain in left knee: M25.562

## 2020-06-08 NOTE — Assessment & Plan Note (Signed)
Will check lipids, thyroid hormone, blood counts, kidney and liver function, and electrolytes today.  Advised to closely monitor BP at home and notify clinic if it sustains >140/90.

## 2020-06-08 NOTE — Patient Instructions (Addendum)
https://www.cdc.gov/vaccines/hcp/vis/vis-statements/tdap.pdf">  [place] Tdap (ttanos, difteria y [place]): lo que debe saber Tdap (Tetanus, Diphtheria, Pertussis) Vaccine: What You Need to Know 1. Por qu vacunarse? La vacuna Tdap puede prevenir el ttanos, la difteria y la tos [place]. La difteria y la tos [place] se [place] de persona a Social worker. El ttanos ingresa al organismo a travs de cortes o heridas.  [place] (T) provoca rigidez dolorosa en los msculos. El ttanos puede causar graves problemas de [place], como no poder abrir la boca, Warehouse manager dificultad para tragar y Industrial/product designer, o la muerte.  La DIFTERIA (D) puede causar dificultad para respirar, insuficiencia cardaca, parlisis o muerte.  La TOS FERINA (aP) tambin conocida como "tos convulsa" puede causar tos violenta e incontrolable lo que hace difcil respirar, comer o beber. La tos ferina puede ser muy grave en los bebs y en los nios pequeos, y causar neumona, convulsiones, dao cerebral o la [place]. En adolescentes y [address], puede causar prdida de [place], prdida del control de la vejiga, [place] y fracturas de Forensic psychologist al toser de Wellsite geologist intensa. 2. Madilyn Fireman Tdap La vacuna Tdap es solo para nios de 7 aos en adelante, adolescentes y [place].  Los adolescentes debe recibir una dosis nica de la vacuna Tdap, preferentemente a los 11 o 12 aos. Las mujeres embarazadas deben recibir una dosis de la vacuna Tdap en cada embarazo para proteger al recin nacido de la tos [place]. Los bebs tienen mayor riesgo de sufrir complicaciones graves y potencialmente mortales debido a la tos [place]. Los adultos que nunca recibieron la vacuna Tdap deben recibir una dosis. Adems, los adultos deben recibir una dosis de refuerzo cada 10 aos, o antes si la persona sufre una [place] o una herida grave y [place]. Las dosis de refuerzo pueden ser de la vacuna Tdap o la Td (una vacuna diferente que protege contra el ttanos y la difteria, pero no contra  la tos [place]). La vacuna Tdap puede ser administrada al mismo tiempo que otras vacunas. 3. Hable con el mdico Comunquese con la persona que le coloca las vacunas si la persona que la recibe:  Ha tenido una reaccin alrgica despus de [place]omia Dear dosis anterior de cualquier vacuna contra el ttanos, la difteria o la tos [place], o cualquier alergia grave, potencialmente mortal.  Ha tenido un coma, disminucin del nivel de la conciencia o convulsiones prolongadas dentro de los [address] posteriores a una dosis anterior de cualquier vacuna contra la tos ferina (DTP, DTaP o Tdap).  Tiene convulsiones u otro problema del sistema nervioso.  Alguna vez tuvo sndrome de Guillain-Barr (tambin llamado SGB).  Ha tenido dolor intenso o hinchazn despus de una dosis anterior de cualquier vacuna contra el ttanos o la difteria. En algunos casos, es posible que el mdico decida posponer la aplicacin de la vacuna Tdap para una visita en el futuro.  Las personas que sufren trastornos menores, como un resfro, pueden vacunarse. Las personas que tienen enfermedades moderadas o graves generalmente deben esperar hasta recuperarse para poder recibir la vacuna Tdap.  Su mdico puede darle ms informacin. 4. Riesgos de [place] reaccin a la vacuna  Despus de recibir la vacuna Tdap a veces se puede Surveyor, mining, enrojecimiento o Paramedic donde se aplic la inyeccin, fiebre leve, dolor de cabeza, sensacin de cansancio y nuseas, vmitos, diarrea o dolor de [place]. Las personas a veces se desmayan despus de procedimientos mdicos, incluida la vacunacin. Informe al mdico si se siente mareado, tiene cambios en la visin o zumbidos  en los odos.  Al igual que con cualquier Automatic Data, existe una probabilidad muy remota de que una vacuna cause una reaccin alrgica grave, otra lesin grave o la muerte. 5. Qu pasa si se presenta un problema grave? Podra producirse una reaccin alrgica despus de que la  persona vacunada abandone la clnica. Si observa signos de Runner, broadcasting/film/video grave (ronchas, hinchazn de la cara y la garganta, dificultad para respirar, latidos cardacos acelerados, mareos o debilidad), llame al 9-1-1 y lleve a la persona al hospital ms cercano. Si se presentan otros signos que le preocupan, comunquese con su mdico.  Las reacciones adversas deben informarse al Sistema de Informe de Eventos Adversos de Administrator, arts (Vaccine Adverse Event Reporting System, VAERS). Por lo general, el mdico presenta este informe o puede hacerlo usted mismo. Visite el sitio web del VAERS en www.vaers.LAgents.no o llame al 437-781-9433. El VAERS es solo para Biomedical engineer; su personal no proporciona asesoramiento mdico. 6. Programa Nacional de Compensacin de Daos por [employer] El SunTrust de Compensacin de Daos por Administrator, arts (National Vaccine Injury [employer], Cabin crew) es un programa federal que fue creado para Patent examiner a las personas que puedan haber sufrido daos al recibir ciertas vacunas. Visite el sitio web del VICP en SpiritualWord.at o llame al [phone] para obtener ms informacin acerca del programa y de cmo presentar un reclamo. Hay un lmite de tiempo para presentar un reclamo de compensacin. 7. Cmo puedo obtener ms informacin?  Pregntele a su mdico.  Comunquese con el servicio de salud de su localidad o su estado.  Comunquese con Building control surveyor for [employer] and Prevention, CDC (Centros para el Control y la Prevencin de [place]): ? Llame al [phone] (1-800-CDC-INFO) o ? Visite el sitio Environmental manager en PicCapture.uy Declaracin de informacin de la vacuna Tdap (ttanos, difteria y [place]) (12/22/2018) Esta informacin no tiene Theme park manager el consejo del mdico. Asegrese de hacerle al mdico cualquier pregunta que tenga. Document Revised: 04/16/2019 Document Reviewed: 04/16/2019 Elsevier Patient  Education  2020 Elsevier Inc.  Hipertensin en los adultos Hypertension, Adult El trmino hipertensin es otra forma de denominar a la presin arterial elevada. La presin arterial elevada fuerza al corazn a trabajar ms para bombear la sangre. Esto puede causar problemas con el paso del [place]. Una lectura de presin arterial est compuesta por 2 nmeros. Hay un nmero superior (sistlico) sobre un nmero inferior (diastlico). Lo ideal es tener la presin arterial por debajo de 120/80. Las elecciones saludables pueden ayudar a Personal assistant presin arterial, o tal vez necesite medicamentos para bajarla. Cules son las causas? Se desconoce la causa de esta afeccin. Algunas afecciones pueden estar relacionadas con la presin arterial alta. Qu incrementa el riesgo?  Fumar.  Tener diabetes mellitus tipo 2, colesterol alto, o ambos.  No hacer la cantidad suficiente de actividad fsica o ejercicio.  Tener sobrepeso.  Consumir mucha grasa, azcar, caloras o sal (sodio) en su dieta.  Beber alcohol en exceso.  Tener una enfermedad renal a largo plazo (crnica).  Tener antecedentes familiares de presin arterial alta.  Edad. Los riesgos aumentan con la edad.  Raza. El riesgo es mayor para las Statistician.  Sexo. Antes de los 45aos, los hombres corren ms [employer]. Despus de los 65aos, las mujeres corren ms [employer].  Tener apnea obstructiva del sueo.  Estrs. Cules son los signos o los sntomas?  Es posible que la presin arterial alta puede no cause sntomas. La presin arterial  muy alta (crisis hipertensiva) puede provocar: ? Dolor de [place]. ? Sensaciones de preocupacin o nerviosismo (ansiedad). ? Falta de aire. ? Hemorragia nasal. ? Sensacin de Journalist, newspaper (nuseas). ? Vmitos. ? Cambios en la forma de ver. ? Dolor muy intenso en el pecho. ? Convulsiones. Cmo se trata?  Esta afeccin se trata haciendo  cambios saludables en el estilo de vida, por ejemplo: ? Consumir alimentos saludables. ? Hacer ms ejercicio. ? Beber menos alcohol.  El mdico puede recetarle medicamentos si los cambios en el estilo de vida no son suficientes para Museum/gallery curator la presin arterial y si: ? El nmero de arriba est por encima de 130. ? El nmero de abajo est por encima de 80.  Su presin arterial personal ideal puede variar. Siga estas instrucciones en su casa: Comida y bebida   Si se lo dicen, siga el plan de alimentacin de DASH (Dietary Approaches to Stop Hypertension, Maneras de alimentarse para detener la hipertensin). Para seguir este plan: ? Llene la mitad del plato de cada comida con frutas y verduras. ? Llene un cuarto del plato de cada comida con cereales integrales. Los cereales integrales incluyen pasta integral, arroz integral y pan integral. ? Coma y beba productos lcteos con bajo contenido de grasa, como leche descremada o yogur bajo en grasas. ? Llene un cuarto del plato de cada comida con protenas bajas en grasa (magras). Las protenas bajas en grasa incluyen pescado, pollo sin piel, huevos, frijoles y tofu. ? Evite consumir carne grasa, carne curada y procesada, o pollo con piel. ? Evite consumir alimentos prehechos o procesados.  Consuma menos de 1500 mg de sal por da.  No beba alcohol si: ? El mdico le indica que no lo haga. ? Est embarazada, puede estar embarazada o est tratando de quedar embarazada.  Si bebe alcohol: ? Limite la cantidad que bebe a lo siguiente:  De 0 a 1 medida por da para las mujeres.  De 0 a 2 medidas por da para los hombres. ? Est atento a la cantidad de alcohol que hay en las bebidas que toma. En los [place], una medida equivale a una botella de cerveza de 12oz ( ), un vaso de vino de 5oz ( ) o un vaso de una bebida alcohlica de alta graduacin de 1oz (21ml). Estilo de vida   Trabaje con su mdico para mantenerse en  un peso saludable o para perder peso. Pregntele a su mdico cul es el peso recomendable para usted.  Haga al menos de ejercicio la [employer]V de la [place]. Estos pueden incluir caminar, nadar o andar en bicicleta.  Realice al menos 30 minutos de ejercicio que fortalezca sus msculos (ejercicios de resistencia) al menos 3 das a la [place]. Estos pueden incluir levantar pesas o hacer [place].  No consuma ningn producto que contenga nicotina o tabaco, como cigarrillos, cigarrillos electrnicos y tabaco de Theatre manager. Si necesita ayuda para dejar de fumar, consulte al [employer].  Controle su presin arterial en su casa tal como le indic el mdico.  Concurra a todas las visitas de seguimiento como se lo haya indicado el mdico. Esto es importante. Medicamentos  [employer] de venta libre y los recetados solamente como se lo haya indicado el mdico. Siga cuidadosamente las indicaciones.  No omita las dosis de medicamentos para la presin arterial. Los medicamentos pierden eficacia si omite dosis. El hecho de omitir las dosis tambin [place] el riesgo de otros problemas.  Pregntele a su  mdico a qu efectos secundarios o reacciones a los Careers information officer. Comunquese con un mdico si:  Piensa que tiene [place] reaccin a los medicamentos que est tomando.  Tiene dolores de cabeza frecuentes (recurrentes).  Se siente mareado.  Tiene hinchazn en los tobillos.  Tiene problemas de visin. Solicite ayuda inmediatamente si:  Siente un dolor de cabeza muy intenso.  Empieza a sentirse desorientado (confundido).  Se siente dbil o adormecido.  Siente que va a desmayarse.  Tiene un dolor muy intenso en las siguientes zonas: ? Pecho. ? Vientre (abdomen).  Vomita ms de una vez.  Tiene dificultad para respirar. Resumen  El trmino hipertensin es otra forma de denominar a la presin arterial elevada.  La presin arterial elevada fuerza al  corazn a trabajar ms para bombear la sangre.  Para la [employer], una presin arterial normal es menor que 120/80.  Las decisiones saludables pueden ayudarle a disminuir su presin arterial. Si no puede bajar su presin arterial mediante decisiones saludables, es posible que deba tomar medicamentos. Esta informacin no tiene Marine scientist el consejo del mdico. Asegrese de hacerle al mdico cualquier pregunta que tenga. Document Revised: 09/20/2018 Document Reviewed: 09/20/2018 Elsevier Patient Education  [place].

## 2020-06-08 NOTE — Assessment & Plan Note (Signed)
Acute, ongoing.  Given acute onset, likely to be early arthritis in knee.  Will check x-ray to rule out bone spur or other anatomy-causing factor.  Uric acid checked for baseline, educated on gout and gouty symptoms.  Advised that it is okay to continue Tylenol if it seems to help.

## 2020-06-08 NOTE — Progress Notes (Signed)
BP 132/88 (BP Location: Right Arm, Cuff Size: Normal)   Pulse 75   Temp 98.6 F (37 C) (Oral)   Ht 5' (1.524 m)   Wt 187 lb (84.8 kg)   LMP  (LMP Unknown)   SpO2 100%   BMI 36.52 kg/m    Subjective:    Patient ID: Tara Bishop, female    DOB: 10-05-1971, 49 y.o.   MRN: [id number]  HPI: Tara Bishop is a 49 y.o. female presenting on 06/08/2020 for comprehensive medical examination.  Spanish interpreter used Gillis Santa # [phone]. Current medical complaints include: knee pain  KNEE PAIN Patient reports knee pain that has been going on for months.  She works at a [employer] in [place] and is on her feet for 8 hours at a time.   Duration: months Involved knee: left Mechanism of injury: unknown Location:medial Onset: sudden Severity: severe  Quality:  Sharp pain when walking Frequency: constant Radiation: no; sometimes pain in heel Aggravating factors: random   Alleviating factors: nothing Status: up and down Treatments attempted: Tylenol   Relief with NSAIDs?:  Yes; mild Weakness with weight bearing or walking: yes Sensation of giving way: yes Locking: no Popping: no Bruising: no Swelling: yes below left knee Redness: no Paresthesias/decreased sensation: no Fevers: no  She currently lives with: Husband Menopausal Symptoms: no  Depression Screen done today and results listed below:  Depression screen [facility] 2/9 06/08/2020 04/06/2018 02/17/2016  Decreased Interest 0 0 0  Down, Depressed, Hopeless 0 0 0  PHQ - 2 Score 0 0 0  Altered sleeping 0 - -  Tired, decreased energy 1 - -  Change in appetite 0 - -  Feeling bad or failure about yourself  0 - -  Trouble concentrating 0 - -  Moving slowly or fidgety/restless 0 - -  Suicidal thoughts 0 - -  PHQ-9 Score 1 - -   The patient does not have a history of falls. I did not complete a risk assessment for falls. A plan of care for falls was not documented.  Past Medical History:  History reviewed. No pertinent  past medical history.  Surgical History:  Past Surgical History:  Procedure Laterality Date  . ABDOMINAL HYSTERECTOMY  09/26/14   partial    Medications:  Current Outpatient Medications on File Prior to Visit  Medication Sig  . omeprazole (PRILOSEC) 20 MG capsule Take 20 mg by mouth as needed.   No current facility-administered medications on file prior to visit.    Allergies:  No Known Allergies  Social History:  Social History   Socioeconomic History  . Marital status: Married    Spouse name: Not on file  . Number of children: Not on file  . Years of education: Not on file  . Highest education level: Not on file  Occupational History  . Not on file  Tobacco Use  . Smoking status: Never Smoker  . Smokeless tobacco: Never Used  Vaping Use  . Vaping Use: Never used  Substance and Sexual Activity  . Alcohol use: No    Alcohol/week: 0.0 standard drinks  . Drug use: No  . Sexual activity: Yes  Other Topics Concern  . Not on file  Social History Narrative  . Not on file   Social Determinants of Health   Financial Resource Strain:   . Difficulty of Paying Living Expenses:   Food Insecurity:   . Worried About Programme researcher, broadcasting/film/video in the Last Year:   .  Ran Out of Food in the Last Year:   Transportation Needs:   . Freight forwarder (Medical):   Marland Kitchen Lack of Transportation (Non-Medical):   Physical Activity:   . Days of Exercise per Week:   . Minutes of Exercise per Session:   Stress:   . Feeling of Stress :   Social Connections:   . Frequency of Communication with Friends and Family:   . Frequency of Social Gatherings with Friends and Family:   . Attends Religious Services:   . Active Member of Clubs or Organizations:   . Attends Banker Meetings:   Marland Kitchen Marital Status:   Intimate Partner Violence:   . Fear of Current or Ex-Partner:   . Emotionally Abused:   Marland Kitchen Physically Abused:   . Sexually Abused:    Social History   Tobacco Use  Smoking  Status Never Smoker  Smokeless Tobacco Never Used   Social History   Substance and Sexual Activity  Alcohol Use No  . Alcohol/week: 0.0 standard drinks    Family History:  Family History  Problem Relation Age of Onset  . Cancer Mother    Past medical history, surgical history, medications, allergies, family history and social history reviewed with patient today and changes made to appropriate areas of the chart.   Review of Systems  Constitutional: Negative for fever, malaise/fatigue and weight loss.  HENT: Negative.  Negative for congestion, ear pain and sore throat.   Eyes: Positive for blurred vision. Negative for double vision, pain, discharge and redness.  Respiratory: Negative.  Negative for cough, shortness of breath and wheezing.   Cardiovascular: Negative.  Negative for chest pain, palpitations and leg swelling.  Gastrointestinal: Negative.  Negative for abdominal pain, blood in stool, constipation, diarrhea, heartburn, nausea and vomiting.  Genitourinary: Negative.  Negative for dysuria, frequency, hematuria and urgency.  Musculoskeletal: Positive for joint pain (knee pain). Negative for back pain, myalgias and neck pain.  Skin: Negative.  Negative for itching and rash.  Neurological: Positive for headaches. Negative for dizziness and weakness.  Psychiatric/Behavioral: Negative.  Negative for depression and suicidal ideas. The patient is not nervous/anxious.    All other ROS negative except what is listed above and in the HPI.      Objective:    BP 132/88 (BP Location: Right Arm, Cuff Size: Normal)   Pulse 75   Temp 98.6 F (37 C) (Oral)   Ht 5' (1.524 m)   Wt 187 lb (84.8 kg)   LMP  (LMP Unknown)   SpO2 100%   BMI 36.52 kg/m   Wt Readings from Last 3 Encounters:  06/08/20 187 lb (84.8 kg)  04/06/18 182 lb 4.8 oz (82.7 kg)  03/17/17 185 lb 4.8 oz (84.1 kg)    Physical Exam Vitals and nursing note reviewed. Exam conducted with a chaperone present.    Constitutional:      General: She is not in acute distress.    Appearance: Normal appearance. She is obese. She is not ill-appearing or toxic-appearing.  HENT:     Head: Normocephalic and atraumatic.     Right Ear: Tympanic membrane, ear canal and external ear normal.     Left Ear: Tympanic membrane, ear canal and external ear normal.     Nose: Nose normal. No congestion or rhinorrhea.     Mouth/Throat:     Mouth: Mucous membranes are moist.     Pharynx: Oropharynx is clear. No oropharyngeal exudate.  Eyes:     General: No  scleral icterus.    Extraocular Movements: Extraocular movements intact.     Pupils: Pupils are equal, round, and reactive to light.  Cardiovascular:     Rate and Rhythm: Normal rate and regular rhythm.     Pulses: Normal pulses.     Heart sounds: Normal heart sounds. No murmur heard.   Pulmonary:     Effort: Pulmonary effort is normal. No respiratory distress.     Breath sounds: No wheezing or rhonchi.  Chest:     Breasts:        Right: Normal. No inverted nipple, mass, nipple discharge or skin change.        Left: Normal. No inverted nipple, mass, nipple discharge or skin change.  Abdominal:     General: Abdomen is flat. Bowel sounds are normal. There is no distension.     Palpations: Abdomen is soft.     Tenderness: There is no abdominal tenderness.  Musculoskeletal:        General: No swelling or tenderness. Normal range of motion.     Cervical back: Normal range of motion and neck supple. No rigidity or tenderness.     Right lower leg: No edema.     Left lower leg: No edema.  Skin:    General: Skin is warm and dry.     Capillary Refill: Capillary refill takes less than 2 seconds.     Coloration: Skin is not jaundiced or pale.  Neurological:     General: No focal deficit present.     Mental Status: She is alert and oriented to person, place, and time.     Motor: No weakness.     Gait: Gait normal.  Psychiatric:        Mood and Affect: Mood  normal.        Behavior: Behavior normal.        Thought Content: Thought content normal.        Judgment: Judgment normal.       Assessment & Plan:   Problem List Items Addressed This Visit      Other   Acute pain of left knee    Acute, ongoing.  Given acute onset, likely to be early arthritis in knee.  Will check x-ray to rule out bone spur or other anatomy-causing factor.  Uric acid checked for baseline, educated on gout and gouty symptoms.  Advised that it is okay to continue Tylenol if it seems to help.      Relevant Orders   VITAMIN D 25 Hydroxy (Vit-D Deficiency, Fractures)   Uric acid   DG Knee Complete 4 Views Left   BMI 36.0-36.9,adult    Will check lipids, thyroid hormone, blood counts, kidney and liver function, and electrolytes today.  Advised to closely monitor BP at home and notify clinic if it sustains >140/90.      Relevant Orders   Lipid Panel w/o Chol/HDL Ratio   CBC with Differential/Platelet   Comprehensive metabolic panel   TSH    Other Visit Diagnoses    Annual physical exam    -  Primary   Educated to establish care with eye doctor in town for preventative maintenance and intermittent blurred vision.   Relevant Orders   Lipid Panel w/o Chol/HDL Ratio   CBC with Differential/Platelet   Comprehensive metabolic panel   TSH   VITAMIN D 25 Hydroxy (Vit-D Deficiency, Fractures)   Need for hepatitis C screening test       Relevant Orders   Hepatitis  C antibody   Need for diphtheria-tetanus-pertussis (Tdap) vaccine       Relevant Orders   Tdap vaccine greater than or equal to 7yo IM (Completed)   Screening for cholesterol level       Relevant Orders   Lipid Panel w/o Chol/HDL Ratio   Comprehensive metabolic panel   Screening for thyroid disorder       Relevant Orders   TSH       Follow up plan: Return in about 6 months (around 12/08/2020) for BP follow up.   LABORATORY TESTING:  - Pap smear: not applicable - partial abdominal  hysterectomy  IMMUNIZATIONS:   - Tdap: Tetanus vaccination status reviewed: tdap needed; given today. - Influenza: Postponed to flu season - Pneumovax: Not applicable - Prevnar: Not applicable - HPV: Not applicable - Zostavax vaccine: Not applicable  SCREENING: -Mammogram: Had 1 screening mammogram in 2017 - wants to wait until age 62 to get next  - Colonoscopy: Not applicable  - Bone Density: Not applicable  -Hearing Test: Not applicable  -Spirometry: Not applicable   PATIENT COUNSELING:   Advised to take 1 mg of folate supplement per day if capable of pregnancy.   Sexuality: Discussed sexually transmitted diseases, partner selection, use of condoms, avoidance of unintended pregnancy  and contraceptive alternatives.   Advised to avoid cigarette smoking.  I discussed with the patient that most people either abstain from alcohol or drink within safe limits (<=14/week and <=4 drinks/occasion for males, <=7/weeks and <= 3 drinks/occasion for females) and that the risk for alcohol disorders and other health effects rises proportionally with the number of drinks per week and how often a drinker exceeds daily limits.  Discussed cessation/primary prevention of drug use and availability of treatment for abuse.   Diet: Encouraged to adjust caloric intake to maintain  or achieve ideal body weight, to reduce intake of dietary saturated fat and total fat, to limit sodium intake by avoiding high sodium foods and not adding table salt, and to maintain adequate dietary potassium and calcium preferably from fresh fruits, vegetables, and low-fat dairy products.    stressed the importance of regular exercise  Injury prevention: Discussed safety belts, safety helmets, smoke detector, smoking near bedding or upholstery.   Dental health: Discussed importance of regular tooth brushing, flossing, and dental visits.    NEXT PREVENTATIVE PHYSICAL DUE IN 1 YEAR. Return in about 6 months (around  12/08/2020) for BP follow up.

## 2020-06-09 LAB — CBC WITH DIFFERENTIAL/PLATELET
Basophils Absolute: 0.1 10*3/uL (ref 0.0–0.2)
Basos: 1 %
EOS (ABSOLUTE): 0.1 10*3/uL (ref 0.0–0.4)
Eos: 1 %
Hematocrit: 39.1 % (ref 34.0–46.6)
Hemoglobin: 12.9 g/dL (ref 11.1–15.9)
Immature Grans (Abs): 0 10*3/uL (ref 0.0–0.1)
Immature Granulocytes: 0 %
Lymphocytes Absolute: 3.3 10*3/uL — ABNORMAL HIGH (ref 0.7–3.1)
Lymphs: 29 %
MCH: 29.1 pg (ref 26.6–33.0)
MCHC: 33 g/dL (ref 31.5–35.7)
MCV: 88 fL (ref 79–97)
Monocytes Absolute: 0.8 10*3/uL (ref 0.1–0.9)
Monocytes: 7 %
Neutrophils Absolute: 7.2 10*3/uL — ABNORMAL HIGH (ref 1.4–7.0)
Neutrophils: 62 %
Platelets: 255 10*3/uL (ref 150–450)
RBC: 4.43 x10E6/uL (ref 3.77–5.28)
RDW: 13.4 % (ref 11.7–15.4)
WBC: 11.4 10*3/uL — ABNORMAL HIGH (ref 3.4–10.8)

## 2020-06-09 LAB — LIPID PANEL W/O CHOL/HDL RATIO
Cholesterol, Total: 193 mg/dL (ref 100–199)
HDL: 44 mg/dL (ref >39)
LDL Chol Calc (NIH): 120 mg/dL — ABNORMAL HIGH (ref 0–99)
Triglycerides: 164 mg/dL — ABNORMAL HIGH (ref 0–149)
VLDL Cholesterol Cal: 29 mg/dL (ref 5–40)

## 2020-06-09 LAB — TSH: TSH: 2.52 u[IU]/mL (ref 0.450–4.500)

## 2020-06-09 LAB — COMPREHENSIVE METABOLIC PANEL
ALT: 77 IU/L — ABNORMAL HIGH (ref 0–32)
AST: 55 IU/L — ABNORMAL HIGH (ref 0–40)
Albumin/Globulin Ratio: 1.6 (ref 1.2–2.2)
Albumin: 4.7 g/dL (ref 3.8–4.8)
Alkaline Phosphatase: 92 IU/L (ref 48–121)
BUN/Creatinine Ratio: 18 (ref 9–23)
BUN: 10 mg/dL (ref 6–24)
Bilirubin Total: 0.2 mg/dL (ref 0.0–1.2)
CO2: 23 mmol/L (ref 20–29)
Calcium: 9.7 mg/dL (ref 8.7–10.2)
Chloride: 102 mmol/L (ref 96–106)
Creatinine, Ser: 0.55 mg/dL — ABNORMAL LOW (ref 0.57–1.00)
GFR calc Af Amer: 129 mL/min/{1.73_m2} (ref >59)
GFR calc non Af Amer: 112 mL/min/{1.73_m2} (ref >59)
Globulin, Total: 2.9 g/dL (ref 1.5–4.5)
Glucose: 93 mg/dL (ref 65–99)
Potassium: 4.3 mmol/L (ref 3.5–5.2)
Sodium: 140 mmol/L (ref 134–144)
Total Protein: 7.6 g/dL (ref 6.0–8.5)

## 2020-06-09 LAB — URIC ACID: Uric Acid: 6 mg/dL (ref 2.6–6.2)

## 2020-06-09 LAB — VITAMIN D 25 HYDROXY (VIT D DEFICIENCY, FRACTURES): Vit D, 25-Hydroxy: 19 ng/mL — ABNORMAL LOW (ref 30.0–100.0)

## 2020-06-09 LAB — HEPATITIS C ANTIBODY: Hep C Virus Ab: <0.1 s/co ratio (ref 0.0–0.9)

## 2020-06-09 NOTE — Addendum Note (Signed)
Addended by: Mardene Celeste I on: 06/09/2020 03:18 PM   Modules accepted: Orders

## 2020-07-17 ENCOUNTER — Ambulatory Visit (INDEPENDENT_AMBULATORY_CARE_PROVIDER_SITE_OTHER): Payer: BC Managed Care – PPO | Admitting: Nurse Practitioner

## 2020-07-17 ENCOUNTER — Encounter: Payer: Self-pay | Admitting: Nurse Practitioner

## 2020-07-17 ENCOUNTER — Other Ambulatory Visit: Payer: Self-pay

## 2020-07-17 VITALS — BP 121/84 | HR 72 | Temp 98.1°F | Wt 184.0 lb

## 2020-07-17 DIAGNOSIS — M546 Pain in thoracic spine: Secondary | ICD-10-CM

## 2020-07-17 DIAGNOSIS — R7989 Other specified abnormal findings of blood chemistry: Secondary | ICD-10-CM

## 2020-07-17 DIAGNOSIS — R1032 Left lower quadrant pain: Secondary | ICD-10-CM | POA: Diagnosis not present

## 2020-07-17 DIAGNOSIS — E559 Vitamin D deficiency, unspecified: Secondary | ICD-10-CM

## 2020-07-17 DIAGNOSIS — D729 Disorder of white blood cells, unspecified: Secondary | ICD-10-CM | POA: Diagnosis not present

## 2020-07-17 LAB — UA/M W/RFLX CULTURE, ROUTINE
Bilirubin, UA: NEGATIVE
Glucose, UA: NEGATIVE
Leukocytes,UA: NEGATIVE
Nitrite, UA: NEGATIVE
Protein,UA: NEGATIVE
RBC, UA: NEGATIVE
Specific Gravity, UA: 1.02 (ref 1.005–1.030)
Urobilinogen, Ur: 0.2 mg/dL (ref 0.2–1.0)
pH, UA: 5 (ref 5.0–7.5)

## 2020-07-17 NOTE — Assessment & Plan Note (Signed)
Acute, ongoing.  Continue Vitamin D repletion for now, recheck Vitamin D in September.

## 2020-07-17 NOTE — Patient Instructions (Addendum)
Nice seeing you today, Tara Bishop!  We will let you know about your lab results on Monday.  Be sure to try the exercises for your back and let us know if they do not help and you would like a referral to a Physical Therapist.  You will hear from [place] to schedule the ultrasound of your abdomen.  Let us know if you have any questions or concerns.  Back Exercises These exercises help to make your trunk and back strong. They also help to keep the lower back flexible. Doing these exercises can help to prevent back pain or lessen existing pain.  If you have back pain, try to do these exercises 2-3 times each day or as told by your doctor.  As you get better, do the exercises once each day. Repeat the exercises more often as told by your doctor.  To stop back pain from coming back, do the exercises once each day, or as told by your doctor. Exercises Single knee to chest Do these steps 3-5 times in a row for each leg: 1. Lie on your back on a firm bed or the floor with your legs stretched out. 2. Bring one knee to your chest. 3. Grab your knee or thigh with both hands and hold them it in place. 4. Pull on your knee until you feel a gentle stretch in your lower back or buttocks. 5. Keep doing the stretch for 10-30 seconds. 6. Slowly let go of your leg and straighten it. Pelvic tilt Do these steps 5-10 times in a row: 1. Lie on your back on a firm bed or the floor with your legs stretched out. 2. Bend your knees so they point up to the ceiling. Your feet should be flat on the floor. 3. Tighten your lower belly (abdomen) muscles to press your lower back against the floor. This will make your tailbone point up to the ceiling instead of pointing down to your feet or the floor. 4. Stay in this position for 5-10 seconds while you gently tighten your muscles and breathe evenly. Cat-cow Do these steps until your lower back bends more easily: 1. Get on your hands and knees on a firm surface. Keep your  hands under your shoulders, and keep your knees under your hips. You may put padding under your knees. 2. Let your head hang down toward your chest. Tighten (contract) the muscles in your belly. Point your tailbone toward the floor so your lower back becomes rounded like the back of a cat. 3. Stay in this position for 5 seconds. 4. Slowly lift your head. Let the muscles of your belly relax. Point your tailbone up toward the ceiling so your back forms a sagging arch like the back of a cow. 5. Stay in this position for 5 seconds.  Press-ups Do these steps 5-10 times in a row: 1. Lie on your belly (face-down) on the floor. 2. Place your hands near your head, about shoulder-width apart. 3. While you keep your back relaxed and keep your hips on the floor, slowly straighten your arms to raise the top half of your body and lift your shoulders. Do not use your back muscles. You may change where you place your hands in order to make yourself more comfortable. 4. Stay in this position for 5 seconds. 5. Slowly return to lying flat on the floor.  Bridges Do these steps 10 times in a row: 1. Lie on your back on a firm surface. 2. Bend your knees  so they point up to the ceiling. Your feet should be flat on the floor. Your arms should be flat at your sides, next to your body. 3. Tighten your butt muscles and lift your butt off the floor until your waist is almost as high as your knees. If you do not feel the muscles working in your butt and the back of your thighs, slide your feet 1-2 inches farther away from your butt. 4. Stay in this position for 3-5 seconds. 5. Slowly lower your butt to the floor, and let your butt muscles relax. If this exercise is too easy, try doing it with your arms crossed over your chest. Belly crunches Do these steps 5-10 times in a row: 1. Lie on your back on a firm bed or the floor with your legs stretched out. 2. Bend your knees so they point up to the ceiling. Your feet  should be flat on the floor. 3. Cross your arms over your chest. 4. Tip your chin a little bit toward your chest but do not bend your neck. 5. Tighten your belly muscles and slowly raise your chest just enough to lift your shoulder blades a tiny bit off of the floor. Avoid raising your body higher than that, because it can put too much stress on your low back. 6. Slowly lower your chest and your head to the floor. Back lifts Do these steps 5-10 times in a row: 1. Lie on your belly (face-down) with your arms at your sides, and rest your forehead on the floor. 2. Tighten the muscles in your legs and your butt. 3. Slowly lift your chest off of the floor while you keep your hips on the floor. Keep the back of your head in line with the curve in your back. Look at the floor while you do this. 4. Stay in this position for 3-5 seconds. 5. Slowly lower your chest and your face to the floor. Contact a doctor if:  Your back pain gets a lot worse when you do an exercise.  Your back pain does not get better 2 hours after you exercise. If you have any of these problems, stop doing the exercises. Do not do them again unless your doctor says it is okay. Get help right away if:  You have sudden, very bad back pain. If this happens, stop doing the exercises. Do not do them again unless your doctor says it is okay. This information is not intended to replace advice given to you by your health care provider. Make sure you discuss any questions you have with your health care provider. Document Revised: 08/30/2018 Document Reviewed: 08/30/2018 Elsevier Patient Education  2020 [employer].

## 2020-07-17 NOTE — Assessment & Plan Note (Signed)
Acute, on most recent labs.  No s/s infection today; will recheck CBC today.  May consider imaging of back if pain persistent and WBC remains elevated.

## 2020-07-17 NOTE — Assessment & Plan Note (Signed)
Acute, on most recent lab findings.  HCV negative and does not drink alcohol.  Could possibly be related to fatty liver disease, however differential remains wide.  Recheck CMP today and continue heart healthy diet.

## 2020-07-17 NOTE — Assessment & Plan Note (Signed)
Acute, ongoing.  Likely muscle strain.  Treat conservatively with back exercises, discussed that oftentimes acute back pain is self-limiting.  PT offered, patient declined for now but will let us know if she changes her mind.

## 2020-07-17 NOTE — Assessment & Plan Note (Signed)
Chronic, ongoing since hysterectomy surgery.  No clear evidence of hernia on physical exam, although could be likely with history of abdominal hysterectomy.  Will obtain UA to rule out UTI and abdominal ultrasound imaging.

## 2020-07-17 NOTE — Progress Notes (Signed)
BP 121/84   Pulse 72   Temp 98.1 F (36.7 C) (Oral)   Wt 184 lb (83.5 kg)   LMP  (LMP Unknown)   SpO2 99%   BMI 35.94 kg/m    Subjective:    Patient ID: Tara Bishop, female    DOB: August 28, 1971, 49 y.o.   MRN: [id number]  HPI: Tara Bishop is a 49 y.o. female presenting for follow up.  Interpreter services used with [place] interpreter, Cicero Duck.  Chief Complaint  Patient presents with  . Follow-up    4 weeks for abnormal labs    BACK PAIN Started having back pain about 3 weeks ago; does not remember injuring self; the pain has been increasing over time. Duration: weeks Mechanism of injury: unknown Location: mid-right Onset: gradual Severity: 8/10 at the most Quality: very sharp Frequency: constant Radiation: none Aggravating factors: working after a couple of hours, when moves her hands her back hurts Alleviating factors: Tylenol sometimes Status: better, got massage Treatments attempted: Tylenol, massage   Relief with NSAIDs?: mild Nighttime pain:  no Paresthesias / decreased sensation:  yes; in feet Bowel / bladder incontinence:  no Fevers:  no Dysuria / urinary frequency: no  ELEVATED LIVER ENZYMES New finding on most recent CMP.  Reports she does not drink alcohol. She does like fried foods, but has been eating mostly salad and vegetables and lean meats since her last visit because of her liver levels.  LOW VITAMIN D Has been taking Vitamin D3 800 IU three times daily.  No issues with taking this medication.   ELEVATED WBC New finding on recent CBC.  Denies upper respiratory symptoms, no UTI symptoms, does report lower abdominal pain that she has had for years.   ABDOMINAL PAIN  LLQ pain - once per week, also happens when she lifts heavy objects Duration:years Onset: gradual Severity: severe Quality: sharp Location:  LLQ  Episode duration: lasts for about 1-2 days, comes back Radiation: no Frequency: intermittent Alleviating factors:  camomile tea,  Aggravating factors: lifting heavy weights Status: stable Treatments attempted: camomile tea Fever: no Nausea: no Vomiting: no Weight loss: no Decreased appetite: no Diarrhea: no Constipation: no Blood in stool: no Heartburn: yes; sometimes Jaundice: no Rash: no Dysuria/urinary frequency: no Hematuria: no History of sexually transmitted disease: no Recurrent NSAID use: yes  No Known Allergies  Outpatient Encounter Medications as of 07/17/2020  Medication Sig  . Cholecalciferol (VITAMIN D3) 20 MCG (800 UNIT) TABS Take 800 Units by mouth in the morning, at noon, and at bedtime.  Marland Kitchen omeprazole (PRILOSEC) 20 MG capsule Take 20 mg by mouth as needed.   No facility-administered encounter medications on file as of 07/17/2020.   Patient Active Problem List   Diagnosis Date Noted  . Abnormal WBC count 07/17/2020  . Elevated LFTs 07/17/2020  . Acute right-sided thoracic back pain 07/17/2020  . LLQ abdominal pain 07/17/2020  . Vitamin D deficiency 07/17/2020  . BMI 36.0-36.9,adult 06/08/2020   Past Medical History:  Diagnosis Date  . Acute pain of left knee 06/08/2020    Relevant past medical, surgical, family and social history reviewed and updated as indicated. Interim medical history since our last visit reviewed.  Review of Systems  Constitutional: Negative.  Negative for activity change, appetite change, fatigue, fever and unexpected weight change.  HENT: Negative.   Respiratory: Negative.   Cardiovascular: Negative.   Gastrointestinal: Positive for abdominal pain. Negative for blood in stool, constipation, diarrhea, nausea and vomiting.  Genitourinary: Negative.  Negative for dysuria, flank pain, frequency, hematuria, pelvic pain and urgency.  Musculoskeletal: Positive for back pain. Negative for gait problem, joint swelling and myalgias.  Skin: Negative.  Negative for rash.  Neurological: Negative.  Negative for dizziness, weakness, light-headedness,  numbness and headaches.  Psychiatric/Behavioral: Negative.  Negative for confusion. The patient is not nervous/anxious.     Per HPI unless specifically indicated above     Objective:    BP 121/84   Pulse 72   Temp 98.1 F (36.7 C) (Oral)   Wt 184 lb (83.5 kg)   LMP  (LMP Unknown)   SpO2 99%   BMI 35.94 kg/m   Wt Readings from Last 3 Encounters:  07/17/20 184 lb (83.5 kg)  06/08/20 187 lb (84.8 kg)  04/06/18 182 lb 4.8 oz (82.7 kg)    Physical Exam Vitals and nursing note reviewed.  Constitutional:      General: She is not in acute distress.    Appearance: Normal appearance.  Eyes:     General: No scleral icterus.    Extraocular Movements: Extraocular movements intact.  Cardiovascular:     Rate and Rhythm: Normal rate.     Heart sounds: Normal heart sounds. No murmur heard.   Pulmonary:     Effort: Pulmonary effort is normal. No respiratory distress.     Breath sounds: Normal breath sounds. No wheezing.  Abdominal:     General: Abdomen is flat. Bowel sounds are normal. There is no distension.     Palpations: Abdomen is soft. There is no mass.     Tenderness: There is no abdominal tenderness.  Musculoskeletal:     Thoracic back: Bony tenderness present. No swelling, edema or deformity. Normal range of motion.  Skin:    General: Skin is warm and dry.     Coloration: Skin is not jaundiced or pale.  Neurological:     General: No focal deficit present.     Mental Status: She is alert and oriented to person, place, and time.     Motor: No weakness.     Gait: Gait normal.  Psychiatric:        Mood and Affect: Mood normal.        Behavior: Behavior normal.        Thought Content: Thought content normal.        Judgment: Judgment normal.    Results for orders placed or performed in visit on 06/08/20  Hepatitis C antibody  Result Value Ref Range   Hep C Virus Ab <0.1 0.0 - 0.9 s/co ratio  Lipid Panel w/o Chol/HDL Ratio  Result Value Ref Range   Cholesterol, Total  193 100 - 199 mg/dL   Triglycerides 330 (H) 0 - 149 mg/dL   HDL 44 >07 mg/dL   VLDL Cholesterol Cal 29 5 - 40 mg/dL   LDL Chol Calc (NIH) 622 (H) 0 - 99 mg/dL  CBC with Differential/Platelet  Result Value Ref Range   WBC 11.4 (H) 3.4 - 10.8 x10E3/uL   RBC 4.43 3.77 - 5.28 x10E6/uL   Hemoglobin 12.9 11.1 - 15.9 g/dL   Hematocrit 63.3 35.4 - 46.6 %   MCV 88 79 - 97 fL   MCH 29.1 26.6 - 33.0 pg   MCHC 33.0 31 - 35 g/dL   RDW 56.2 56.3 - 89.3 %   Platelets 255 150 - 450 x10E3/uL   Neutrophils 62 Not Estab. %   Lymphs 29 Not Estab. %   Monocytes 7 Not Estab. %  Eos 1 Not Estab. %   Basos 1 Not Estab. %   Neutrophils Absolute 7.2 (H) 1 - 7 x10E3/uL   Lymphocytes Absolute 3.3 (H) 0 - 3 x10E3/uL   Monocytes Absolute 0.8 0 - 0 x10E3/uL   EOS (ABSOLUTE) 0.1 0.0 - 0.4 x10E3/uL   Basophils Absolute 0.1 0 - 0 x10E3/uL   Immature Granulocytes 0 Not Estab. %   Immature Grans (Abs) 0.0 0.0 - 0.1 x10E3/uL  Comprehensive metabolic panel  Result Value Ref Range   Glucose 93 65 - 99 mg/dL   BUN 10 6 - 24 mg/dL   Creatinine, Ser 5.36 (L) 0.57 - 1.00 mg/dL   GFR calc non Af Amer 112 >59 mL/min/1.73   GFR calc Af Amer 129 >59 mL/min/1.73   BUN/Creatinine Ratio 18 9 - 23   Sodium 140 134 - 144 mmol/L   Potassium 4.3 3.5 - 5.2 mmol/L   Chloride 102 96 - 106 mmol/L   CO2 23 20 - 29 mmol/L   Calcium 9.7 8.7 - 10.2 mg/dL   Total Protein 7.6 6.0 - 8.5 g/dL   Albumin 4.7 3.8 - 4.8 g/dL   Globulin, Total 2.9 1.5 - 4.5 g/dL   Albumin/Globulin Ratio 1.6 1.2 - 2.2   Bilirubin Total 0.2 0.0 - 1.2 mg/dL   Alkaline Phosphatase 92 48 - 121 IU/L   AST 55 (H) 0 - 40 IU/L   ALT 77 (H) 0 - 32 IU/L  TSH  Result Value Ref Range   TSH 2.520 0.450 - 4.500 uIU/mL  VITAMIN D 25 Hydroxy (Vit-D Deficiency, Fractures)  Result Value Ref Range   Vit D, 25-Hydroxy 19.0 (L) 30.0 - 100.0 ng/mL  Uric acid  Result Value Ref Range   Uric Acid 6.0 2.6 - 6.2 mg/dL      Assessment & Plan:   Problem List Items  Addressed This Visit      Other   Abnormal WBC count    Acute, on most recent labs.  No s/s infection today; will recheck CBC today.  May consider imaging of back if pain persistent and WBC remains elevated.      Relevant Orders   CBC with Differential/Platelet   Elevated LFTs    Acute, on most recent lab findings.  HCV negative and does not drink alcohol.  Could possibly be related to fatty liver disease, however differential remains wide.  Recheck CMP today and continue heart healthy diet.      Relevant Orders   Comprehensive metabolic panel   Acute right-sided thoracic back pain - Primary    Acute, ongoing.  Likely muscle strain.  Treat conservatively with back exercises, discussed that oftentimes acute back pain is self-limiting.  PT offered, patient declined for now but will let us know if she changes her mind.      Relevant Orders   UA/M w/rflx Culture, Routine   LLQ abdominal pain    Chronic, ongoing since hysterectomy surgery.  No clear evidence of hernia on physical exam, although could be likely with history of abdominal hysterectomy.  Will obtain UA to rule out UTI and abdominal ultrasound imaging.        Relevant Orders   DG Abd 1 View   Vitamin D deficiency    Acute, ongoing.  Continue Vitamin D repletion for now, recheck Vitamin D in September.          Follow up plan: Return for pending bloodwork.

## 2020-07-18 LAB — CBC WITH DIFFERENTIAL/PLATELET
Basophils Absolute: 0.1 10*3/uL (ref 0.0–0.2)
Basos: 1 %
EOS (ABSOLUTE): 0.1 10*3/uL (ref 0.0–0.4)
Eos: 1 %
Hematocrit: 39.6 % (ref 34.0–46.6)
Hemoglobin: 13.1 g/dL (ref 11.1–15.9)
Immature Grans (Abs): 0 10*3/uL (ref 0.0–0.1)
Immature Granulocytes: 0 %
Lymphocytes Absolute: 2.9 10*3/uL (ref 0.7–3.1)
Lymphs: 29 %
MCH: 28.4 pg (ref 26.6–33.0)
MCHC: 33.1 g/dL (ref 31.5–35.7)
MCV: 86 fL (ref 79–97)
Monocytes Absolute: 0.6 10*3/uL (ref 0.1–0.9)
Monocytes: 6 %
Neutrophils Absolute: 6.5 10*3/uL (ref 1.4–7.0)
Neutrophils: 63 %
Platelets: 270 10*3/uL (ref 150–450)
RBC: 4.61 x10E6/uL (ref 3.77–5.28)
RDW: 13.7 % (ref 11.7–15.4)
WBC: 10.2 10*3/uL (ref 3.4–10.8)

## 2020-07-18 LAB — COMPREHENSIVE METABOLIC PANEL
ALT: 78 IU/L — ABNORMAL HIGH (ref 0–32)
AST: 62 IU/L — ABNORMAL HIGH (ref 0–40)
Albumin/Globulin Ratio: 1.7 (ref 1.2–2.2)
Albumin: 4.5 g/dL (ref 3.8–4.8)
Alkaline Phosphatase: 86 IU/L (ref 48–121)
BUN/Creatinine Ratio: 14 (ref 9–23)
BUN: 9 mg/dL (ref 6–24)
Bilirubin Total: 0.4 mg/dL (ref 0.0–1.2)
CO2: 24 mmol/L (ref 20–29)
Calcium: 9.5 mg/dL (ref 8.7–10.2)
Chloride: 100 mmol/L (ref 96–106)
Creatinine, Ser: 0.63 mg/dL (ref 0.57–1.00)
GFR calc Af Amer: 124 mL/min/{1.73_m2} (ref >59)
GFR calc non Af Amer: 107 mL/min/{1.73_m2} (ref >59)
Globulin, Total: 2.7 g/dL (ref 1.5–4.5)
Glucose: 89 mg/dL (ref 65–99)
Potassium: 4.5 mmol/L (ref 3.5–5.2)
Sodium: 139 mmol/L (ref 134–144)
Total Protein: 7.2 g/dL (ref 6.0–8.5)

## 2020-09-01 ENCOUNTER — Emergency Department: Payer: BC Managed Care – PPO

## 2020-09-01 ENCOUNTER — Emergency Department
Admission: EM | Admit: 2020-09-01 | Discharge: 2020-09-01 | Disposition: A | Discharge status: Home / Self Care | Payer: BC Managed Care – PPO | Attending: Emergency Medicine | Admitting: Emergency Medicine

## 2020-09-01 ENCOUNTER — Other Ambulatory Visit: Payer: Self-pay

## 2020-09-01 ENCOUNTER — Encounter: Payer: Self-pay | Admitting: Emergency Medicine

## 2020-09-01 DIAGNOSIS — M549 Dorsalgia, unspecified: Secondary | ICD-10-CM | POA: Diagnosis not present

## 2020-09-01 DIAGNOSIS — Z20822 Contact with and (suspected) exposure to covid-19: Secondary | ICD-10-CM | POA: Insufficient documentation

## 2020-09-01 DIAGNOSIS — R1013 Epigastric pain: Secondary | ICD-10-CM | POA: Insufficient documentation

## 2020-09-01 DIAGNOSIS — R112 Nausea with vomiting, unspecified: Secondary | ICD-10-CM | POA: Diagnosis not present

## 2020-09-01 DIAGNOSIS — R109 Unspecified abdominal pain: Secondary | ICD-10-CM | POA: Diagnosis present

## 2020-09-01 LAB — URINALYSIS, COMPLETE (UACMP) WITH MICROSCOPIC
Bacteria, UA: NONE SEEN
Bilirubin Urine: NEGATIVE
Glucose, UA: NEGATIVE mg/dL
Hgb urine dipstick: NEGATIVE
Ketones, ur: NEGATIVE mg/dL
Leukocytes,Ua: NEGATIVE
Nitrite: NEGATIVE
Protein, ur: NEGATIVE mg/dL
Specific Gravity, Urine: 1.012 (ref 1.005–1.030)
pH: 8 (ref 5.0–8.0)

## 2020-09-01 LAB — BASIC METABOLIC PANEL
Anion gap: 13 (ref 5–15)
BUN: 9 mg/dL (ref 6–20)
CO2: 27 mmol/L (ref 22–32)
Calcium: 9.5 mg/dL (ref 8.9–10.3)
Chloride: 99 mmol/L (ref 98–111)
Creatinine, Ser: 0.53 mg/dL (ref 0.44–1.00)
GFR calc Af Amer: >60 mL/min (ref >60)
GFR calc non Af Amer: >60 mL/min (ref >60)
Glucose, Bld: 115 mg/dL — ABNORMAL HIGH (ref 70–99)
Potassium: 4 mmol/L (ref 3.5–5.1)
Sodium: 139 mmol/L (ref 135–145)

## 2020-09-01 LAB — CBC
HCT: 39.9 % (ref 36.0–46.0)
Hemoglobin: 13.4 g/dL (ref 12.0–15.0)
MCH: 29 pg (ref 26.0–34.0)
MCHC: 33.6 g/dL (ref 30.0–36.0)
MCV: 86.4 fL (ref 80.0–100.0)
Platelets: 253 10*3/uL (ref 150–400)
RBC: 4.62 MIL/uL (ref 3.87–5.11)
RDW: 14 % (ref 11.5–15.5)
WBC: 7.9 10*3/uL (ref 4.0–10.5)
nRBC: 0 % (ref 0.0–0.2)

## 2020-09-01 LAB — TROPONIN I (HIGH SENSITIVITY): Troponin I (High Sensitivity): <2 ng/L (ref <18)

## 2020-09-01 LAB — SARS CORONAVIRUS 2 BY RT PCR (HOSPITAL ORDER, PERFORMED IN ~~LOC~~ HOSPITAL LAB): SARS Coronavirus 2: NEGATIVE

## 2020-09-01 LAB — LIPASE, BLOOD: Lipase: 33 U/L (ref 11–51)

## 2020-09-01 MED ORDER — ONDANSETRON 4 MG PO TBDP: 4.0000 mg | ORAL_TABLET | Freq: Three times a day (TID) | ORAL | 0 refills | Status: DC | PRN

## 2020-09-01 MED ORDER — IOHEXOL 300 MG/ML  SOLN
100.0000 mL | Freq: Once | INTRAMUSCULAR | Status: AC | PRN
  Administered 2020-09-01: 100 mL via INTRAVENOUS
  Filled 2020-09-01: qty 100

## 2020-09-01 MED ORDER — ONDANSETRON HCL 4 MG/2ML IJ SOLN
4.0000 mg | Freq: Once | INTRAMUSCULAR | Status: AC
  Administered 2020-09-01: 4 mg via INTRAVENOUS
  Filled 2020-09-01: qty 2

## 2020-09-01 MED ORDER — MORPHINE SULFATE (PF) 4 MG/ML IV SOLN
4.0000 mg | Freq: Once | INTRAVENOUS | Status: AC
  Administered 2020-09-01: 4 mg via INTRAVENOUS
  Filled 2020-09-01: qty 1

## 2020-09-01 MED ORDER — LACTATED RINGERS IV BOLUS
1000.0000 mL | Freq: Once | INTRAVENOUS | Status: AC
  Administered 2020-09-01: 1000 mL via INTRAVENOUS

## 2020-09-01 NOTE — ED Provider Notes (Signed)
[facility] Emergency Department Provider Note   ____________________________________________   First MD Initiated Contact with Patient 09/01/20 1346     (approximate)  I have reviewed the triage vital signs and the nursing notes.   HISTORY  Chief Complaint Abdominal Pain    HPI Tara Bishop is a 49 y.o. female status post hysterectomy who presents to the ED complaining of abdominal pain.  History is limited as patient is Spanish-speaking only and history obtained via in-person interpreter.  Patient reports she has had approximately 1 week of constant pain in her epigastrium that radiates to her mid and lower back.  She describes the pain as strong and throbbing, exacerbated when she goes to eat or drink.  She has felt nauseous but has not vomited and denies any changes in her bowel movements.  She felt feverish with chills last night, but has not taken her temperature at home.  She denies similar symptoms in the past and has never been told that she has gallstones before.  She denies any dysuria, hematuria, vaginal bleeding, or vaginal discharge.        Past Medical History:  Diagnosis Date   Acute pain of left knee 06/08/2020    Patient Active Problem List   Diagnosis Date Noted   Abnormal WBC count 07/17/2020   Elevated LFTs 07/17/2020   Acute right-sided thoracic back pain 07/17/2020   LLQ abdominal pain 07/17/2020   Vitamin D deficiency 07/17/2020   BMI 36.0-36.9,adult 06/08/2020    Past Surgical History:  Procedure Laterality Date   ABDOMINAL HYSTERECTOMY  09/26/14   partial    Prior to Admission medications   Medication Sig Start Date End Date Taking? Authorizing Provider  Cholecalciferol (VITAMIN D3) 20 MCG (800 UNIT) TABS Take 800 Units by mouth in the morning, at noon, and at bedtime.    [provider]  omeprazole (PRILOSEC) 20 MG capsule Take 20 mg by mouth as needed.    [provider]  ondansetron  (ZOFRAN ODT) 4 MG disintegrating tablet Take 1 tablet (4 mg total) by mouth every 8 (eight) hours as needed for nausea or vomiting. 09/01/20   Chesley Noon, MD    Allergies Patient has no known allergies.  Family History  Problem Relation Age of Onset   Cancer Mother     Social History Social History   Tobacco Use   Smoking status: Never Smoker   Smokeless tobacco: Never Used  Building services engineer Use: Never used  Substance Use Topics   Alcohol use: No    Alcohol/week: 0.0 standard drinks   Drug use: No    Review of Systems  Constitutional: No fever/chills Eyes: No visual changes. ENT: No sore throat. Cardiovascular: Denies chest pain. Respiratory: Denies shortness of breath. Gastrointestinal: Positive for abdominal pain.  Positive for nausea, no vomiting.  No diarrhea.  No constipation. Genitourinary: Negative for dysuria. Musculoskeletal: Positive for back pain. Skin: Negative for rash. Neurological: Negative for headaches, focal weakness or numbness.  ____________________________________________   PHYSICAL EXAM:  VITAL SIGNS: ED Triage Vitals  Enc Vitals Group     BP 09/01/20 1218 131/89     Pulse Rate 09/01/20 1218 65     Resp 09/01/20 1218 18     Temp 09/01/20 1218 98.4 F (36.9 C)     Temp Source 09/01/20 1218 Oral     SpO2 09/01/20 1218 98 %     Weight 09/01/20 1233 180 lb (81.6 kg)  Height 09/01/20 1233 5\' 2"  (1.575 m)     Head Circumference --      Peak Flow --      Pain Score 09/01/20 1232 8     Pain Loc --      Pain Edu? --      Excl. in GC? --     Constitutional: Alert and oriented. Eyes: Conjunctivae are normal. Head: Atraumatic. Nose: No congestion/rhinnorhea. Mouth/Throat: Mucous membranes are moist. Neck: Normal ROM Cardiovascular: Normal rate, regular rhythm. Grossly normal heart sounds. Respiratory: Normal respiratory effort.  No retractions. Lungs CTAB. Gastrointestinal: Soft and tender to palpation in the epigastrium  with no rebound or guarding. No distention.  No CVA tenderness bilaterally. Genitourinary: deferred Musculoskeletal: No lower extremity tenderness nor edema. Neurologic:  Normal speech and language. No gross focal neurologic deficits are appreciated. Skin:  Skin is warm, dry and intact. No rash noted. Psychiatric: Mood and affect are normal. Speech and behavior are normal.  ____________________________________________   LABS (all labs ordered are listed, but only abnormal results are displayed)  Labs Reviewed  BASIC METABOLIC PANEL - Abnormal; Notable for the following components:      Result Value   Glucose, Bld 115 (*)    All other components within normal limits  URINALYSIS, COMPLETE (UACMP) WITH MICROSCOPIC - Abnormal; Notable for the following components:   Color, Urine YELLOW (*)    APPearance HAZY (*)    All other components within normal limits  SARS CORONAVIRUS 2 BY RT PCR (HOSPITAL ORDER, PERFORMED IN [facility])  CBC  LIPASE, BLOOD  TROPONIN I (HIGH SENSITIVITY)    PROCEDURES  Procedure(s) performed (including Critical Care):  Procedures  ED ECG REPORT I, 09/03/20, the attending physician, personally viewed and interpreted this ECG.   Date: 09/01/2020  EKG Time: 12:23  Rate: 64  Rhythm: normal sinus rhythm  Axis: Normal  Intervals:none  ST&T Change: None  ____________________________________________   INITIAL IMPRESSION / ASSESSMENT AND PLAN / ED COURSE       49 year old female who is status post hysterectomy presents to the ED complaining of constant throbbing pain in her epigastrium for about the past week that is worse with eating and associated with nausea.  She denies any history of gallstones but her symptoms could be explained by biliary colic and we will further assess with CT scan given her tenderness on exam.  Lab work thus far is reassuring, LFTs and lipase within normal limits.  We will also hydrate with IV fluids, treat  patient's pain with IV morphine and Zofran.  UA shows no evidence of infection.  CT scan does not show any intra-abdominal process to be causing patient's pain, gallbladder exam is unremarkable.  She does have possible groundglass opacity in lungs bilaterally, continues to deny any fevers, cough, or shortness of breath.  It is possible that her GI symptoms are related to COVID-19 infection and PCR testing was performed.  Patient otherwise reports that her symptoms are improved and she is appropriate for discharge home with prescription for Zofran.  She was counseled to return to the ED for new or worsening symptoms.  Patient agrees with plan.      ____________________________________________   FINAL CLINICAL IMPRESSION(S) / ED DIAGNOSES  Final diagnoses:  Epigastric pain  Non-intractable vomiting with nausea, unspecified vomiting type  Suspected COVID-19 virus infection     ED Discharge Orders         Ordered    ondansetron (ZOFRAN ODT) 4 MG disintegrating  tablet  Every 8 hours PRN        09/01/20 1610           Note:  This document was prepared using Dragon voice recognition software and may include unintentional dictation errors.   Chesley Noon, MD 09/01/20 [phone]

## 2020-09-01 NOTE — ED Triage Notes (Signed)
Pt in via POV, reports ongoing epigastric pain and lower back pain x approximately one week, reports some nausea, and low grade fever.  Vitals WDL, NAD noted at this time.

## 2020-09-03 ENCOUNTER — Other Ambulatory Visit: Payer: Self-pay

## 2020-10-01 ENCOUNTER — Ambulatory Visit: Payer: BC Managed Care – PPO | Admitting: Unknown Physician Specialty

## 2020-10-01 ENCOUNTER — Ambulatory Visit: Admit: 2020-10-01 | Payer: BC Managed Care – PPO

## 2020-10-01 ENCOUNTER — Other Ambulatory Visit: Payer: Self-pay

## 2020-10-01 ENCOUNTER — Encounter: Payer: Self-pay | Admitting: Unknown Physician Specialty

## 2020-10-01 ENCOUNTER — Ambulatory Visit
Admission: RE | Admit: 2020-10-01 | Discharge: 2020-10-01 | Disposition: A | Discharge status: Home / Self Care | Payer: BC Managed Care – PPO | Source: Ambulatory Visit | Attending: Unknown Physician Specialty | Admitting: Unknown Physician Specialty

## 2020-10-01 ENCOUNTER — Ambulatory Visit
Admission: RE | Admit: 2020-10-01 | Discharge: 2020-10-01 | Disposition: A | Discharge status: Home / Self Care | Payer: BC Managed Care – PPO | Attending: Unknown Physician Specialty | Admitting: Unknown Physician Specialty

## 2020-10-01 VITALS — BP 147/80 | HR 72 | Temp 98.2°F | Ht 62.0 in | Wt 190.4 lb

## 2020-10-01 DIAGNOSIS — R9389 Abnormal findings on diagnostic imaging of other specified body structures: Secondary | ICD-10-CM

## 2020-10-01 DIAGNOSIS — R7989 Other specified abnormal findings of blood chemistry: Secondary | ICD-10-CM

## 2020-10-01 DIAGNOSIS — M546 Pain in thoracic spine: Secondary | ICD-10-CM

## 2020-10-01 LAB — CBC WITH DIFFERENTIAL/PLATELET
Hematocrit: 38.6 % (ref 34.0–46.6)
Hemoglobin: 12.9 g/dL (ref 11.1–15.9)
Lymphocytes Absolute: 3.2 10*3/uL — ABNORMAL HIGH (ref 0.7–3.1)
Lymphs: 41 %
MCH: 28.9 pg (ref 26.6–33.0)
MCHC: 33.4 g/dL (ref 31.5–35.7)
MCV: 87 fL (ref 79–97)
MID (Absolute): 0.3 10*3/uL (ref 0.1–1.6)
MID: 3 %
Neutrophils Absolute: 4.4 10*3/uL (ref 1.4–7.0)
Neutrophils: 56 %
Platelets: 244 10*3/uL (ref 150–450)
RBC: 4.46 x10E6/uL (ref 3.77–5.28)
RDW: 14.5 % (ref 11.7–15.4)
WBC: 7.9 10*3/uL (ref 3.4–10.8)

## 2020-10-01 MED ORDER — CYCLOBENZAPRINE HCL 10 MG PO TABS: 10.0000 mg | ORAL_TABLET | Freq: Every day | ORAL | 0 refills | Status: DC

## 2020-10-01 MED ORDER — DICLOFENAC SODIUM 1 % EX GEL: 4.0000 g | Freq: Four times a day (QID) | CUTANEOUS | 0 refills | Status: DC

## 2020-10-01 NOTE — Progress Notes (Signed)
BP (!) 147/80 (BP Location: Left Arm, Patient Position: Sitting, Cuff Size: Large)   Pulse 72   Temp 98.2 F (36.8 C) (Oral)   Ht 5\' 2"  (1.575 m)   Wt 190 lb 6.4 oz (86.4 kg)   LMP  (LMP Unknown)   SpO2 97%   BMI 34.82 kg/m    Subjective:    Patient ID: , female    DOB: 06/16/1971, 49 y.o.   MRN: [id number]  HPI: Tara Bishop is a 49 y.o. female  Chief Complaint  Patient presents with  . Back Pain    x several weeks. Patient reports pain is constent day and night.   Visit done with the assistance of the spanish interpreter.  Went to the hospital on 9/14 for abd pain.  Ground glass opacities noted in lungs with that CT scan.  No cough or SOB  Back Pain This is a new problem. Episode onset: 2 month. The problem occurs constantly. The problem is unchanged. The pain is present in the thoracic spine (points to the left of her thoracic spine). Quality: intense pain. The pain does not radiate. The pain is severe. The pain is the same all the time. Exacerbated by: moving hands. Associated symptoms include tingling. Pertinent negatives include no numbness, paresthesias or weakness. (Tingling in right hand.  States feels feverish when pain is most severe.  States pain is worse while at work when using hands.   ) Risk factors include obesity. Treatments tried: Tyleonol. The treatment provided mild relief.   Liver enzymes Noted high last visit and she has questions.    Relevant past medical, surgical, family and social history reviewed and updated as indicated. Interim medical history since our last visit reviewed. Allergies and medications reviewed and updated.  Review of Systems  Musculoskeletal: Positive for back pain.  Neurological: Positive for tingling. Negative for weakness, numbness and paresthesias.    Per HPI unless specifically indicated above     Objective:    BP (!) 147/80 (BP Location: Left Arm, Patient Position: Sitting, Cuff Size: Large)    Pulse 72   Temp 98.2 F (36.8 C) (Oral)   Ht 5\' 2"  (1.575 m)   Wt 190 lb 6.4 oz (86.4 kg)   LMP  (LMP Unknown)   SpO2 97%   BMI 34.82 kg/m   Wt Readings from Last 3 Encounters:  10/01/20 190 lb 6.4 oz (86.4 kg)  09/01/20 180 lb (81.6 kg)  07/17/20 184 lb (83.5 kg)    Physical Exam Constitutional:      General: She is not in acute distress.    Appearance: Normal appearance. She is well-developed.  HENT:     Head: Normocephalic and atraumatic.  Eyes:     General: Lids are normal. No scleral icterus.       Right eye: No discharge.        Left eye: No discharge.     Conjunctiva/sclera: Conjunctivae normal.  Neck:     Vascular: No carotid bruit or JVD.  Cardiovascular:     Rate and Rhythm: Normal rate and regular rhythm.     Heart sounds: Normal heart sounds.  Pulmonary:     Effort: Pulmonary effort is normal.     Breath sounds: Normal breath sounds.  Abdominal:     Palpations: There is no hepatomegaly or splenomegaly.  Musculoskeletal:        General: Normal range of motion.     Cervical back: Normal range of motion and  neck supple.     Thoracic back: Spasms and tenderness present. No swelling, deformity or lacerations.     Comments: Tender over areas of discomfort  Skin:    General: Skin is warm and dry.     Coloration: Skin is not pale.     Findings: No rash.  Neurological:     Mental Status: She is alert and oriented to person, place, and time.  Psychiatric:        Behavior: Behavior normal.        Thought Content: Thought content normal.        Judgment: Judgment normal.    CBC is normal  Results for orders placed or performed during the hospital encounter of 09/01/20  SARS Coronavirus 2 by RT PCR (hospital order, performed in [facility] hospital lab) Nasopharyngeal Nasopharyngeal Swab   Specimen: Nasopharyngeal Swab  Result Value Ref Range   SARS Coronavirus 2 NEGATIVE NEGATIVE  Basic metabolic panel  Result Value Ref Range   Sodium 139 135 - 145 mmol/L     Potassium 4.0 3.5 - 5.1 mmol/L   Chloride 99 98 - 111 mmol/L   CO2 27 22 - 32 mmol/L   Glucose, Bld 115 (H) 70 - 99 mg/dL   BUN 9 6 - 20 mg/dL   Creatinine, Ser 7.54 0.44 - 1.00 mg/dL   Calcium 9.5 8.9 - 49.2 mg/dL   GFR calc non Af Amer >60 >60 mL/min   GFR calc Af Amer >60 >60 mL/min   Anion gap 13 5 - 15  CBC  Result Value Ref Range   WBC 7.9 4.0 - 10.5 K/uL   RBC 4.62 3.87 - 5.11 MIL/uL   Hemoglobin 13.4 12.0 - 15.0 g/dL   HCT 01.0 36 - 46 %   MCV 86.4 80.0 - 100.0 fL   MCH 29.0 26.0 - 34.0 pg   MCHC 33.6 30.0 - 36.0 g/dL   RDW 07.1 21.9 - 75.8 %   Platelets 253 150 - 400 K/uL   nRBC 0.0 0.0 - 0.2 %  Lipase, blood  Result Value Ref Range   Lipase 33 11 - 51 U/L  Urinalysis, Complete w Microscopic  Result Value Ref Range   Color, Urine YELLOW (A) YELLOW   APPearance HAZY (A) CLEAR   Specific Gravity, Urine 1.012 1.005 - 1.030   pH 8.0 5.0 - 8.0   Glucose, UA NEGATIVE NEGATIVE mg/dL   Hgb urine dipstick NEGATIVE NEGATIVE   Bilirubin Urine NEGATIVE NEGATIVE   Ketones, ur NEGATIVE NEGATIVE mg/dL   Protein, ur NEGATIVE NEGATIVE mg/dL   Nitrite NEGATIVE NEGATIVE   Leukocytes,Ua NEGATIVE NEGATIVE   RBC / HPF 0-5 0 - 5 RBC/hpf   WBC, UA 0-5 0 - 5 WBC/hpf   Bacteria, UA NONE SEEN NONE SEEN   Squamous Epithelial / LPF 6-10 0 - 5  Troponin I (High Sensitivity)  Result Value Ref Range   Troponin I (High Sensitivity) <2 <18 ng/L      Assessment & Plan:   Problem List Items Addressed This Visit      Unprioritized   Elevated LFTs    Discussed weight loss and probable fatty liver.  No abnormalities noted on CT       Other Visit Diagnoses    Right-sided thoracic back pain, unspecified chronicity    -  Primary   Refer to PT.  RX for muscle relaxant at night.  Will give Voltaren cream during the day QID   Relevant Medications  cyclobenzaprine (FLEXERIL) 10 MG tablet   Other Relevant Orders   CBC With Differential/Platelet   DG Chest 2 View   Ambulatory referral  to Physical Therapy   Abnormal chest CT       Ground glass ? pre-infectious.  CBC is nl.  Will get chest x-ray        Follow up plan: Return if symptoms worsen or fail to improve.

## 2020-10-01 NOTE — Assessment & Plan Note (Signed)
Discussed weight loss and probable fatty liver.  No abnormalities noted on CT

## 2020-10-07 ENCOUNTER — Telehealth: Payer: Self-pay

## 2020-10-07 DIAGNOSIS — R935 Abnormal findings on diagnostic imaging of other abdominal regions, including retroperitoneum: Secondary | ICD-10-CM

## 2020-10-07 NOTE — Telephone Encounter (Signed)
Please Advise.  KP

## 2020-10-07 NOTE — Telephone Encounter (Signed)
Pt doesn't have a dpr   Copied from CRM [id number]. Topic: General - Other >> Oct 07, 2020  3:24 PM Tara Bishop wrote: Reason for CRM: Pts husband called and is requesting to have the results from the pts recent X Ray. Please advise.

## 2020-10-08 NOTE — Telephone Encounter (Signed)
Called spoke to pt husband. Told him that  pt X-ray is normal. But we will order a CT scan to follow up from the previous one. Pts husband verbalized understanding.  KP

## 2020-10-08 NOTE — Telephone Encounter (Signed)
Patient is ok with having CT scan, please order.

## 2020-10-16 ENCOUNTER — Telehealth: Payer: Self-pay

## 2020-10-16 NOTE — Telephone Encounter (Signed)
Copied from CRM [id number]. Topic: General - Other >> Oct 16, 2020  8:44 AM Wyonia Hough E wrote: Reason for CRM: Pt called to check on her Xray results / please advise

## 2020-10-16 NOTE — Telephone Encounter (Signed)
Patient advised of normal x-ray result, and appt for CT scan advised.

## 2020-10-30 ENCOUNTER — Other Ambulatory Visit: Payer: Self-pay

## 2020-10-30 ENCOUNTER — Ambulatory Visit
Admission: RE | Admit: 2020-10-30 | Discharge: 2020-10-30 | Disposition: A | Discharge status: Home / Self Care | Payer: BC Managed Care – PPO | Source: Ambulatory Visit | Attending: Unknown Physician Specialty | Admitting: Unknown Physician Specialty

## 2020-10-30 DIAGNOSIS — R935 Abnormal findings on diagnostic imaging of other abdominal regions, including retroperitoneum: Secondary | ICD-10-CM | POA: Insufficient documentation

## 2022-08-06 IMAGING — DX DG CHEST 2V
2 series · 2 of 2 positions shown · non-contrast
Comparison: 09/01/2020 CT

CLINICAL DATA: RIGHT chest pain for 3 months. Ground-glass
opacities identified on recent abdominal CT.

EXAM:
CHEST - 2 VIEW

[chest pa]
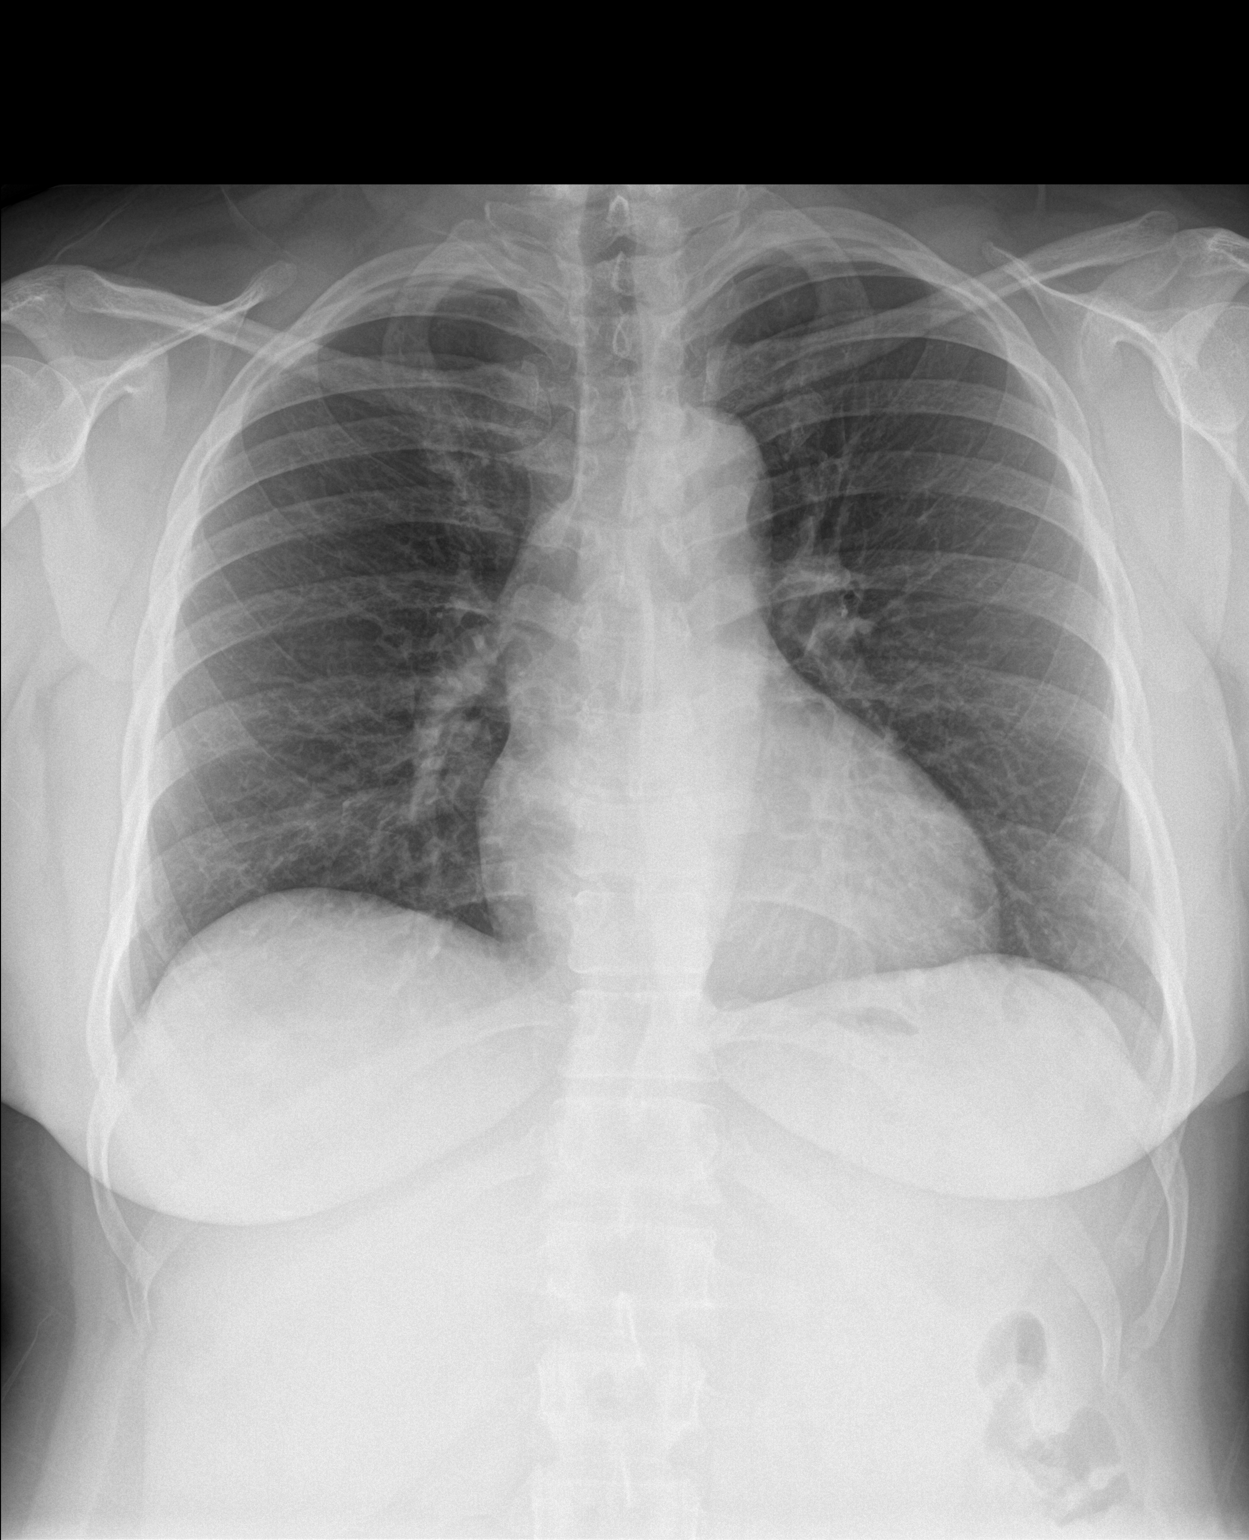

[chest lat]
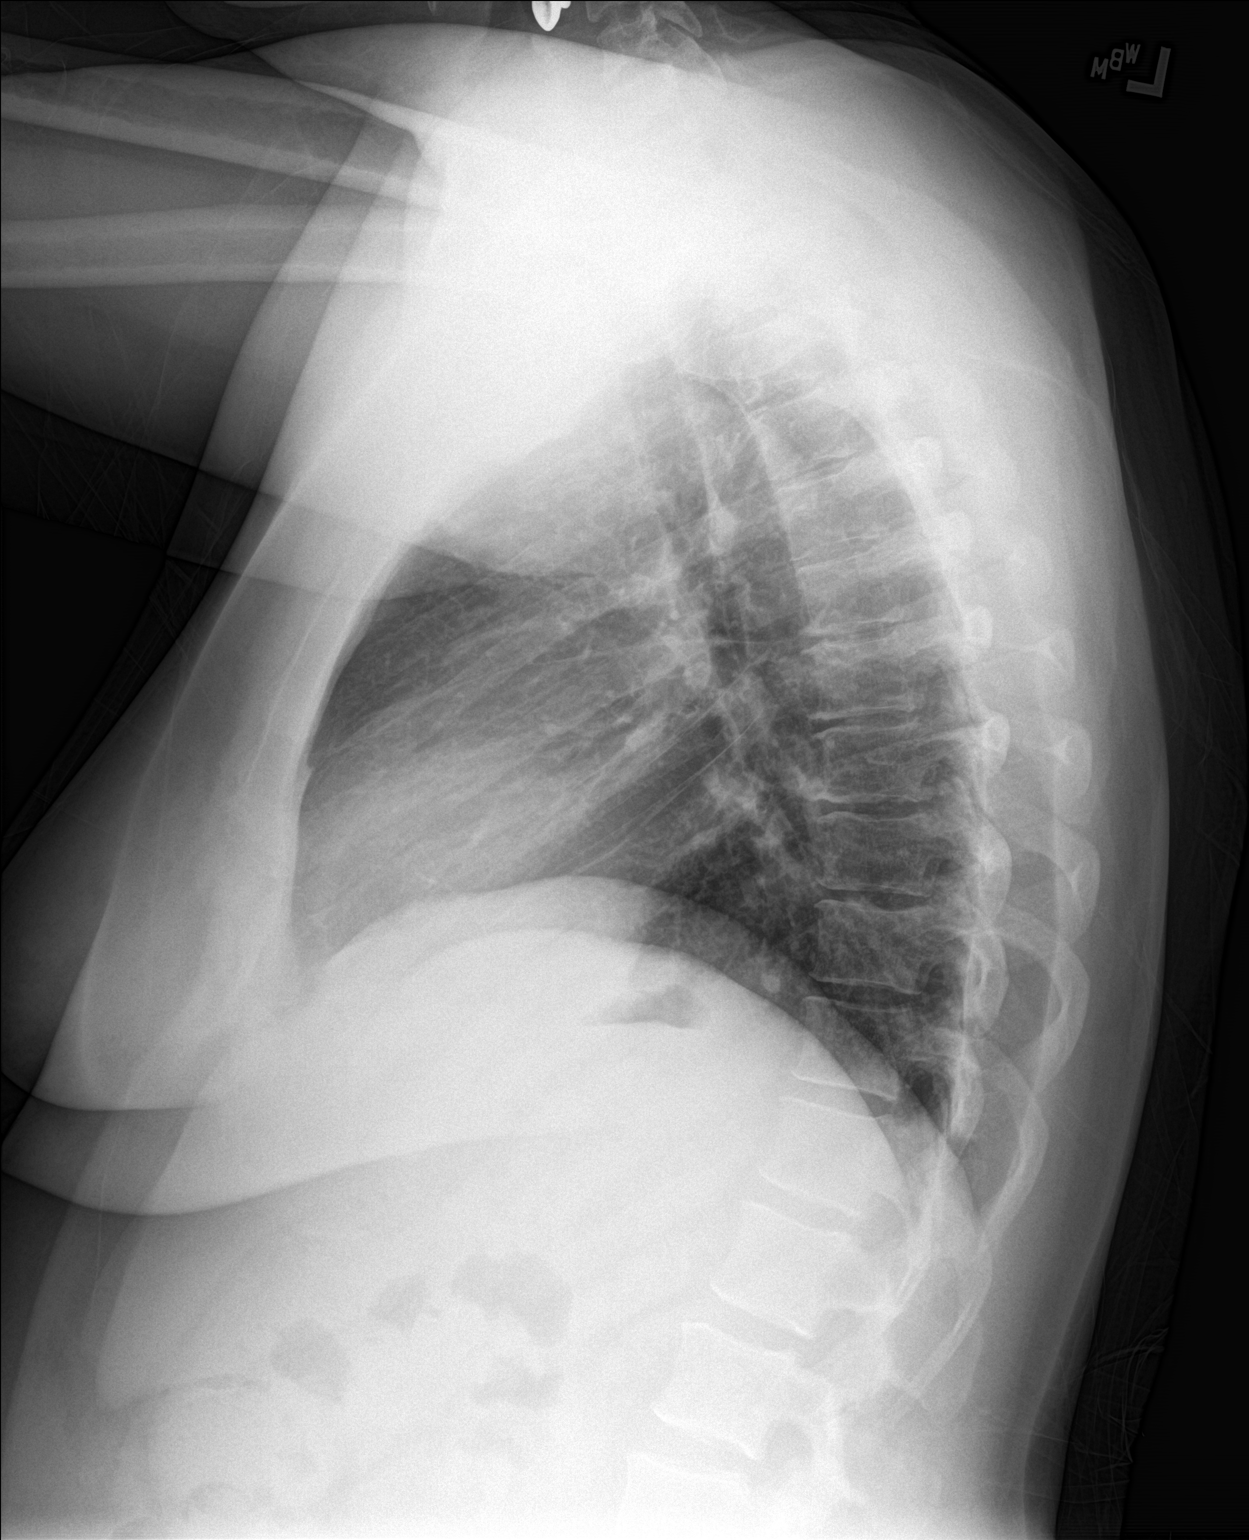

[2 of 2 positions shown; findings below may reference images not displayed]

FINDINGS: The cardiomediastinal silhouette is unremarkable.

There is no evidence of focal airspace disease, pulmonary edema,
suspicious pulmonary nodule/mass, pleural effusion, or pneumothorax.

No acute bony abnormalities are identified.
IMPRESSION: No active cardiopulmonary disease. The ground-glass opacities
identified 09/01/2020 abdominal CT may be difficult to visualize on
chest radiographs.

## 2023-04-10 ENCOUNTER — Other Ambulatory Visit: Payer: Self-pay

## 2023-04-10 ENCOUNTER — Encounter: Payer: Self-pay | Admitting: Emergency Medicine

## 2023-04-10 ENCOUNTER — Emergency Department
Admission: EM | Admit: 2023-04-10 | Discharge: 2023-04-10 | Disposition: A | Discharge status: Home / Self Care | Payer: BC Managed Care – PPO | Attending: Emergency Medicine | Admitting: Emergency Medicine

## 2023-04-10 ENCOUNTER — Emergency Department: Payer: BC Managed Care – PPO

## 2023-04-10 DIAGNOSIS — I10 Essential (primary) hypertension: Secondary | ICD-10-CM | POA: Diagnosis not present

## 2023-04-10 DIAGNOSIS — R0789 Other chest pain: Secondary | ICD-10-CM | POA: Diagnosis not present

## 2023-04-10 DIAGNOSIS — R079 Chest pain, unspecified: Secondary | ICD-10-CM | POA: Diagnosis not present

## 2023-04-10 LAB — CBC
HCT: 41.9 % (ref 36.0–46.0)
Hemoglobin: 13.7 g/dL (ref 12.0–15.0)
MCH: 29.1 pg (ref 26.0–34.0)
MCHC: 32.7 g/dL (ref 30.0–36.0)
MCV: 89.1 fL (ref 80.0–100.0)
Platelets: 247 10*3/uL (ref 150–400)
RBC: 4.7 MIL/uL (ref 3.87–5.11)
RDW: 13.9 % (ref 11.5–15.5)
WBC: 9.8 10*3/uL (ref 4.0–10.5)
nRBC: 0 % (ref 0.0–0.2)

## 2023-04-10 LAB — BASIC METABOLIC PANEL
Anion gap: 6 (ref 5–15)
BUN: 18 mg/dL (ref 6–20)
CO2: 25 mmol/L (ref 22–32)
Calcium: 8.8 mg/dL — ABNORMAL LOW (ref 8.9–10.3)
Chloride: 105 mmol/L (ref 98–111)
Creatinine, Ser: 0.63 mg/dL (ref 0.44–1.00)
GFR, Estimated: >60 mL/min (ref >60)
Glucose, Bld: 115 mg/dL — ABNORMAL HIGH (ref 70–99)
Potassium: 4.2 mmol/L (ref 3.5–5.1)
Sodium: 136 mmol/L (ref 135–145)

## 2023-04-10 LAB — TROPONIN I (HIGH SENSITIVITY): Troponin I (High Sensitivity): <2 ng/L (ref <18)

## 2023-04-10 MED ORDER — PANTOPRAZOLE SODIUM 40 MG PO TBEC: 40.0000 mg | DELAYED_RELEASE_TABLET | Freq: Every day | ORAL | 0 refills | Status: DC

## 2023-04-10 MED ORDER — HYDROCHLOROTHIAZIDE 25 MG PO TABS: 25.0000 mg | ORAL_TABLET | Freq: Every day | ORAL | 2 refills | Status: DC

## 2023-04-10 NOTE — ED Triage Notes (Signed)
First nurse note: C/o headaches, loss of appetite, elevated blood pressures, and chest pain for one month.   Patient brought over by [facility].

## 2023-04-10 NOTE — ED Triage Notes (Signed)
Patient to ED via POV for left sided chest pain and elevated blood pressure x1 month. States she hasn't been able to sleep well recently. NAD noted.

## 2023-04-10 NOTE — ED Provider Notes (Signed)
[facility] Provider Note    Event Date/Time   First MD Initiated Contact with Patient 04/10/23 1152     (approximate)   History   High blood pressure, headache  HPI  Tara Bishop is a 52 y.o. female who presents with complaints of high blood pressure and frequent headaches.  Patient reports she has been checking her blood pressure routinely over the last several weeks and is noted to be elevated, particularly diastolic number over 100.  She thinks this is giving her intermittent headaches.  She denies neurodeficits.  She also describes intermittent chest discomfort but none today.  She is not on any medications for blood pressure     Physical Exam   Triage Vital Signs: ED Triage Vitals  Enc Vitals Group     BP 04/10/23 1139 (!) 127/102     Pulse Rate 04/10/23 1139 (!) 55     Resp 04/10/23 1139 18     Temp 04/10/23 1139 97.8 F (36.6 C)     Temp Source 04/10/23 1139 Oral     SpO2 04/10/23 1139 99 %     Weight --      Height --      Head Circumference --      Peak Flow --      Pain Score 04/10/23 1140 8     Pain Loc --      Pain Edu? --      Excl. in GC? --     Most recent vital signs: Vitals:   04/10/23 1139 04/10/23 1300  BP: (!) 127/102 (!) 124/98  Pulse: (!) 55 (!) 58  Resp: 18 16  Temp: 97.8 F (36.6 C) 97.8 F (36.6 C)  SpO2: 99% 99%     General: Awake, no distress.  CV:  Good peripheral perfusion.  Resp:  Normal effort.  Abd:  No distention.  Other:     ED Results / Procedures / Treatments   Labs (all labs ordered are listed, but only abnormal results are displayed) Labs Reviewed  BASIC METABOLIC PANEL - Abnormal; Notable for the following components:      Result Value   Glucose, Bld 115 (*)    Calcium 8.8 (*)    All other components within normal limits  CBC  TROPONIN I (HIGH SENSITIVITY)  TROPONIN I (HIGH SENSITIVITY)     EKG  ED ECG REPORT I, Jene Every, the attending physician, personally  viewed and interpreted this ECG.  Date: 04/10/2023  Rhythm: normal sinus rhythm QRS Axis: normal Intervals: normal ST/T Wave abnormalities: normal Narrative Interpretation: no evidence of acute ischemia    RADIOLOGY Chest x-ray viewed interpret by me, no acute abnormality    PROCEDURES:  Critical Care performed:   Procedures   MEDICATIONS ORDERED IN ED: Medications - No data to display   IMPRESSION / MDM / ASSESSMENT AND PLAN / ED COURSE  I reviewed the triage vital signs and the nursing notes. Patient's presentation is most consistent with acute presentation with potential threat to life or bodily function.  Patient presents with complaints as above.  She is primarily concerned about her high blood pressure, minimally elevated here although diastolic was over 100.  No chest pain today.  EKG, high sensitive troponin are reassuring, lab work is unremarkable.  No indication for admission at this time, will start the patient on antihypertensives and provide refills, have urged patient to follow-up closely with PCP as soon as possible  FINAL CLINICAL IMPRESSION(S) / ED DIAGNOSES   Final diagnoses:  Uncontrolled hypertension     Rx / DC Orders   ED Discharge Orders          Ordered    hydrochlorothiazide (HYDRODIURIL) 25 MG tablet  Daily        04/10/23 1250    pantoprazole (PROTONIX) 40 MG tablet  Daily        04/10/23 1250             Note:  This document was prepared using Dragon voice recognition software and may include unintentional dictation errors.   Jene Every, MD 04/10/23 1341

## 2023-04-13 ENCOUNTER — Encounter: Payer: Self-pay | Admitting: Nurse Practitioner

## 2023-04-13 ENCOUNTER — Ambulatory Visit (INDEPENDENT_AMBULATORY_CARE_PROVIDER_SITE_OTHER): Payer: BC Managed Care – PPO | Admitting: Nurse Practitioner

## 2023-04-13 VITALS — BP 128/85 | HR 57 | Temp 97.9°F | Ht 62.0 in | Wt 174.0 lb

## 2023-04-13 DIAGNOSIS — R7989 Other specified abnormal findings of blood chemistry: Secondary | ICD-10-CM

## 2023-04-13 DIAGNOSIS — E7800 Pure hypercholesterolemia, unspecified: Secondary | ICD-10-CM | POA: Diagnosis not present

## 2023-04-13 DIAGNOSIS — Z23 Encounter for immunization: Secondary | ICD-10-CM

## 2023-04-13 DIAGNOSIS — R7309 Other abnormal glucose: Secondary | ICD-10-CM

## 2023-04-13 DIAGNOSIS — Z1211 Encounter for screening for malignant neoplasm of colon: Secondary | ICD-10-CM

## 2023-04-13 DIAGNOSIS — Z0000 Encounter for general adult medical examination without abnormal findings: Secondary | ICD-10-CM

## 2023-04-13 DIAGNOSIS — I10 Essential (primary) hypertension: Secondary | ICD-10-CM | POA: Insufficient documentation

## 2023-04-13 DIAGNOSIS — G4709 Other insomnia: Secondary | ICD-10-CM

## 2023-04-13 DIAGNOSIS — Z1231 Encounter for screening mammogram for malignant neoplasm of breast: Secondary | ICD-10-CM

## 2023-04-13 DIAGNOSIS — Z1331 Encounter for screening for depression: Secondary | ICD-10-CM

## 2023-04-13 DIAGNOSIS — E559 Vitamin D deficiency, unspecified: Secondary | ICD-10-CM | POA: Diagnosis not present

## 2023-04-13 LAB — URINALYSIS, ROUTINE W REFLEX MICROSCOPIC
Bilirubin, UA: NEGATIVE
Glucose, UA: NEGATIVE
Ketones, UA: NEGATIVE
Leukocytes,UA: NEGATIVE
Nitrite, UA: NEGATIVE
Protein,UA: NEGATIVE
Specific Gravity, UA: 1.02 (ref 1.005–1.030)
Urobilinogen, Ur: 0.2 mg/dL (ref 0.2–1.0)
pH, UA: 6 (ref 5.0–7.5)

## 2023-04-13 LAB — MICROSCOPIC EXAMINATION
Bacteria, UA: NONE SEEN
WBC, UA: NONE SEEN /hpf (ref 0–5)

## 2023-04-13 MED ORDER — HYDROCHLOROTHIAZIDE 25 MG PO TABS: 25.0000 mg | ORAL_TABLET | Freq: Every day | ORAL | 0 refills | Status: DC

## 2023-04-13 NOTE — Assessment & Plan Note (Signed)
Labs ordered at visit today.  Will make recommendations based on lab results.   

## 2023-04-13 NOTE — Progress Notes (Signed)
BP 128/85   Pulse (!) 57   Temp 97.9 F (36.6 C) (Oral)   Ht  (1.575 m)   Wt 174 lb (78.9 kg)   LMP  (LMP Unknown)   SpO2 98%   BMI 31.83 kg/m    Subjective:    Patient ID: Tara Bishop, female    DOB: 1971/11/09, 52 y.o.   MRN: [id number]  HPI: Tara Bishop is a 52 y.o. female presenting on 04/13/2023 for comprehensive medical examination. Current medical complaints include: chest pain and not being able to eat  She currently lives with: Menopausal Symptoms: no  HYPERTENSION / HYPERLIPIDEMIA Patient was recently seen in the ER for elevated diastolic blood pressures.  She was started on HCTZ.   Satisfied with current treatment? yes Duration of hypertension: years BP monitoring frequency:  hasn't been checking since she was seen in the ER.  BP range:  BP medication side effects: no Past BP meds: HCTZ Duration of hyperlipidemia: years Cholesterol medication side effects: no Cholesterol supplements: none Past cholesterol medications: none Medication compliance: excellent compliance Aspirin: no Recent stressors: no Recurrent headaches: yes Visual changes: no Palpitations: no Dyspnea: no Chest pain: yes Lower extremity edema: no Dizzy/lightheaded: no  GERD Was started on pantoprazole in the ER and chest pain has improved.   GERD control status: better Satisfied with current treatment? yes Heartburn frequency:  Medication side effects: no  Medication compliance: better  INSOMNIA Duration: months Satisfied with sleep quality: no Difficulty falling asleep: yes Difficulty staying asleep: no Waking a few hours after sleep onset: no Early morning awakenings: no Daytime hypersomnolence: no Wakes feeling refreshed: no Good sleep hygiene: yes Apnea: no Snoring: yes Depressed/anxious mood: yes Recent stress: yes Restless legs/nocturnal leg cramps: no Chronic pain/arthritis: no History of sleep study: no Treatments attempted: none    ELEVATED  DEPRESSION SCREENING Patient states she does feel a little down and depressed.  She doesn't want to start medication today.  Would like to think about it over the next couple of weeks and re discuss at our follow up.  She would consider therapy at some point but would like to work on it on her own at this time.  Denies thoughts of self harm but does feel like she is a burden to others.  Depression Screen done today and results listed below:     04/13/2023    8:36 AM 06/08/2020    2:50 PM 04/06/2018    2:04 PM 02/17/2016   10:00 AM  Depression screen PHQ 2/9  Decreased Interest 0 0 0 0  Down, Depressed, Hopeless 3 0 0 0  PHQ - 2 Score 3 0 0 0  Altered sleeping 3 0    Tired, decreased energy 2 1    Change in appetite 3 0    Feeling bad or failure about yourself  2 0    Trouble concentrating 0 0    Moving slowly or fidgety/restless 0 0    Suicidal thoughts 2 0    PHQ-9 Score 15 1    Difficult doing work/chores Somewhat difficult       The patient does not have a history of falls. I did complete a risk assessment for falls. A plan of care for falls was documented.   Past Medical History:  Past Medical History:  Diagnosis Date   Acute pain of left knee 06/08/2020    Surgical History:  Past Surgical History:  Procedure Laterality Date   ABDOMINAL HYSTERECTOMY  09/26/14  partial    Medications:  Current Outpatient Medications on File Prior to Visit  Medication Sig   cyclobenzaprine (FLEXERIL) 10 MG tablet Take 1 tablet (10 mg total) by mouth at bedtime.   pantoprazole (PROTONIX) 40 MG tablet Take 1 tablet (40 mg total) by mouth daily for 14 days.   No current facility-administered medications on file prior to visit.    Allergies:  No Known Allergies  Social History:  Social History   Socioeconomic History   Marital status: Married    Spouse name: Not on file   Number of children: Not on file   Years of education: Not on file   Highest education level: Not on file   Occupational History   Not on file  Tobacco Use   Smoking status: Never   Smokeless tobacco: Never  Vaping Use   Vaping Use: Never used  Substance and Sexual Activity   Alcohol use: No    Alcohol/week: 0.0 standard drinks of alcohol   Drug use: No   Sexual activity: Yes  Other Topics Concern   Not on file  Social History Narrative   Not on file   Social Determinants of Health   Financial Resource Strain: Not on file  Food Insecurity: Not on file  Transportation Needs: Not on file  Physical Activity: Not on file  Stress: Not on file  Social Connections: Not on file  Intimate Partner Violence: Not on file   Social History   Tobacco Use  Smoking Status Never  Smokeless Tobacco Never   Social History   Substance and Sexual Activity  Alcohol Use No   Alcohol/week: 0.0 standard drinks of alcohol    Family History:  Family History  Problem Relation Age of Onset   Cancer Mother     Past medical history, surgical history, medications, allergies, family history and social history reviewed with patient today and changes made to appropriate areas of the chart.   Review of Systems  Eyes:  Negative for blurred vision and double vision.  Respiratory:  Negative for shortness of breath.   Cardiovascular:  Positive for chest pain. Negative for palpitations and leg swelling.  Neurological:  Positive for headaches. Negative for dizziness.  Psychiatric/Behavioral:  Positive for depression. Negative for suicidal ideas. The patient is nervous/anxious.    All other ROS negative except what is listed above and in the HPI.      Objective:    BP 128/85   Pulse (!) 57   Temp 97.9 F (36.6 C) (Oral)   Ht  (1.575 m)   Wt 174 lb (78.9 kg)   LMP  (LMP Unknown)   SpO2 98%   BMI 31.83 kg/m   Wt Readings from Last 3 Encounters:  04/13/23 174 lb (78.9 kg)  10/01/20 190 lb 6.4 oz (86.4 kg)  09/01/20 180 lb (81.6 kg)    Physical Exam Vitals and nursing note reviewed.   Constitutional:      General: She is awake. She is not in acute distress.    Appearance: Normal appearance. She is well-developed. She is not ill-appearing.  HENT:     Head: Normocephalic and atraumatic.     Right Ear: Hearing, tympanic membrane, ear canal and external ear normal. No drainage.     Left Ear: Hearing, tympanic membrane, ear canal and external ear normal. No drainage.     Nose: Nose normal.     Right Sinus: No maxillary sinus tenderness or frontal sinus tenderness.     Left  Sinus: No maxillary sinus tenderness or frontal sinus tenderness.     Mouth/Throat:     Mouth: Mucous membranes are moist.     Pharynx: Oropharynx is clear. Uvula midline. No pharyngeal swelling, oropharyngeal exudate or posterior oropharyngeal erythema.  Eyes:     General: Lids are normal.        Right eye: No discharge.        Left eye: No discharge.     Extraocular Movements: Extraocular movements intact.     Conjunctiva/sclera: Conjunctivae normal.     Pupils: Pupils are equal, round, and reactive to light.     Visual Fields: Right eye visual fields normal and left eye visual fields normal.  Neck:     Thyroid: No thyromegaly.     Vascular: No carotid bruit.     Trachea: Trachea normal.  Cardiovascular:     Rate and Rhythm: Normal rate and regular rhythm.     Heart sounds: Normal heart sounds. No murmur heard.    No gallop.  Pulmonary:     Effort: Pulmonary effort is normal. No accessory muscle usage or respiratory distress.     Breath sounds: Normal breath sounds.  Chest:  Breasts:    Right: Normal.     Left: Normal.  Abdominal:     General: Bowel sounds are normal.     Palpations: Abdomen is soft. There is no hepatomegaly or splenomegaly.     Tenderness: There is no abdominal tenderness.  Musculoskeletal:        General: Normal range of motion.     Cervical back: Normal range of motion and neck supple.     Right lower leg: No edema.     Left lower leg: No edema.  Lymphadenopathy:      Head:     Right side of head: No submental, submandibular, tonsillar, preauricular or posterior auricular adenopathy.     Left side of head: No submental, submandibular, tonsillar, preauricular or posterior auricular adenopathy.     Cervical: No cervical adenopathy.     Upper Body:     Right upper body: No supraclavicular, axillary or pectoral adenopathy.     Left upper body: No supraclavicular, axillary or pectoral adenopathy.  Skin:    General: Skin is warm and dry.     Capillary Refill: Capillary refill takes less than 2 seconds.     Findings: No rash.  Neurological:     Mental Status: She is alert and oriented to person, place, and time.     Gait: Gait is intact.  Psychiatric:        Attention and Perception: Attention normal.        Mood and Affect: Mood normal.        Speech: Speech normal.        Behavior: Behavior normal. Behavior is cooperative.        Thought Content: Thought content normal.        Judgment: Judgment normal.     Results for orders placed or performed during the hospital encounter of 04/10/23  Basic metabolic panel  Result Value Ref Range   Sodium 136 135 - 145 mmol/L   Potassium 4.2 3.5 - 5.1 mmol/L   Chloride 105 98 - 111 mmol/L   CO2 25 22 - 32 mmol/L   Glucose, Bld 115 (H) 70 - 99 mg/dL   BUN 18 6 - 20 mg/dL   Creatinine, Ser 1.61 0.44 - 1.00 mg/dL   Calcium 8.8 (L) 8.9 - 10.3 mg/dL  GFR, Estimated >60 >60 mL/min   Anion gap 6 5 - 15  CBC  Result Value Ref Range   WBC 9.8 4.0 - 10.5 K/uL   RBC 4.70 3.87 - 5.11 MIL/uL   Hemoglobin 13.7 12.0 - 15.0 g/dL   HCT 11.9 14.7 - 82.9 %   MCV 89.1 80.0 - 100.0 fL   MCH 29.1 26.0 - 34.0 pg   MCHC 32.7 30.0 - 36.0 g/dL   RDW 56.2 13.0 - 86.5 %   Platelets 247 150 - 400 K/uL   nRBC 0.0 0.0 - 0.2 %  Troponin I (High Sensitivity)  Result Value Ref Range   Troponin I (High Sensitivity) <2 <18 ng/L      Assessment & Plan:   Problem List Items Addressed This Visit       Cardiovascular and  Mediastinum   Hypertension    Chronic.  Controlled.  Continue with current medication regimen of HCTZ.  Labs ordered today.  Return to clinic in 1 months for reevaluation.  Call sooner if concerns arise.        Relevant Medications   hydrochlorothiazide (HYDRODIURIL) 25 MG tablet     Other   Elevated LFTs    Labs ordered at visit today.  Will make recommendations based on lab results.        Vitamin D deficiency    Labs ordered at visit today.  Will make recommendations based on lab results.        Relevant Orders   Vitamin D (25 hydroxy)   Hypercholesterolemia    Labs ordered at visit today.  Will make recommendations based on lab results.        Relevant Medications   hydrochlorothiazide (HYDRODIURIL) 25 MG tablet   Other Relevant Orders   Lipid panel   Other insomnia    Ongoing x 1 month.  Patient has not tried any other the counter sleep aids.  Recommend trying Tylenol PM to help with sleep.  Follow up in 1 month.  Call sooner if concerns arise.       Other Visit Diagnoses     Annual physical exam    -  Primary   Health maintenance reviewed during visit today.  Labs ordered.  Shingles shot given.  Mammogram ordered and scheduled during visit. Colonoscopy ordered.   Relevant Orders   CBC with Differential/Platelet   Comprehensive metabolic panel   TSH   Urinalysis, Routine w reflex microscopic   Vitamin D (25 hydroxy)   Positive depression screening       Feels down at this time but feels like she can improve symptoms on her own. Does not want medication or therapy at this time.  Follow up in 1 month.   Need for shingles vaccine       Relevant Orders   Varicella-zoster vaccine subcutaneous   Encounter for screening mammogram for malignant neoplasm of breast       Relevant Orders   MM 3D SCREENING MAMMOGRAM BILATERAL BREAST   Screening for colon cancer       Relevant Orders   Ambulatory referral to Gastroenterology        Follow up plan: Return in about  1 month (around 05/13/2023) for BP Check, Depression/Anxiety FU.   LABORATORY TESTING:  - Pap smear: not applicable  IMMUNIZATIONS:   - Tdap: Tetanus vaccination status reviewed: last tetanus booster within 10 years. - Influenza: Postponed to flu season - Pneumovax: Not applicable - Prevnar: Not applicable - COVID: Not applicable -  HPV: Not applicable - Shingrix vaccine: Administered today  SCREENING: -Mammogram: Ordered today  - Colonoscopy: Ordered today  - Bone Density: Not applicable  -Hearing Test: Not applicable  -Spirometry: Not applicable   PATIENT COUNSELING:   Advised to take 1 mg of folate supplement per day if capable of pregnancy.   Sexuality: Discussed sexually transmitted diseases, partner selection, use of condoms, avoidance of unintended pregnancy  and contraceptive alternatives.   Advised to avoid cigarette smoking.  I discussed with the patient that most people either abstain from alcohol or drink within safe limits (<=14/week and <=4 drinks/occasion for males, <=7/weeks and <= 3 drinks/occasion for females) and that the risk for alcohol disorders and other health effects rises proportionally with the number of drinks per week and how often a drinker exceeds daily limits.  Discussed cessation/primary prevention of drug use and availability of treatment for abuse.   Diet: Encouraged to adjust caloric intake to maintain  or achieve ideal body weight, to reduce intake of dietary saturated fat and total fat, to limit sodium intake by avoiding high sodium foods and not adding table salt, and to maintain adequate dietary potassium and calcium preferably from fresh fruits, vegetables, and low-fat dairy products.    stressed the importance of regular exercise  Injury prevention: Discussed safety belts, safety helmets, smoke detector, smoking near bedding or upholstery.   Dental health: Discussed importance of regular tooth brushing, flossing, and dental visits.     NEXT PREVENTATIVE PHYSICAL DUE IN 1 YEAR. Return in about 1 month (around 05/13/2023) for BP Check, Depression/Anxiety FU.

## 2023-04-13 NOTE — Assessment & Plan Note (Signed)
Ongoing x 1 month.  Patient has not tried any other the counter sleep aids.  Recommend trying Tylenol PM to help with sleep.  Follow up in 1 month.  Call sooner if concerns arise.

## 2023-04-13 NOTE — Assessment & Plan Note (Signed)
Chronic.  Controlled.  Continue with current medication regimen of HCTZ.  Labs ordered today.  Return to clinic in 1 months for reevaluation.  Call sooner if concerns arise.

## 2023-04-13 NOTE — Patient Instructions (Signed)
Your mammogram is scheduled for 05/10/23 at 8:00 AM at [facility] in [place].   Address: [address], [place], [place] [address] Phone: [phone]

## 2023-04-14 LAB — CBC WITH DIFFERENTIAL/PLATELET
Basophils Absolute: 0.1 10*3/uL (ref 0.0–0.2)
Basos: 1 %
EOS (ABSOLUTE): 0.1 10*3/uL (ref 0.0–0.4)
Eos: 1 %
Hematocrit: 45.8 % (ref 34.0–46.6)
Hemoglobin: 14.8 g/dL (ref 11.1–15.9)
Immature Grans (Abs): 0 10*3/uL (ref 0.0–0.1)
Immature Granulocytes: 0 %
Lymphocytes Absolute: 3.1 10*3/uL (ref 0.7–3.1)
Lymphs: 28 %
MCH: 29.1 pg (ref 26.6–33.0)
MCHC: 32.3 g/dL (ref 31.5–35.7)
MCV: 90 fL (ref 79–97)
Monocytes Absolute: 0.7 10*3/uL (ref 0.1–0.9)
Monocytes: 7 %
Neutrophils Absolute: 6.9 10*3/uL (ref 1.4–7.0)
Neutrophils: 63 %
Platelets: 286 10*3/uL (ref 150–450)
RBC: 5.08 x10E6/uL (ref 3.77–5.28)
RDW: 13 % (ref 11.7–15.4)
WBC: 11 10*3/uL — ABNORMAL HIGH (ref 3.4–10.8)

## 2023-04-14 LAB — LIPID PANEL
Chol/HDL Ratio: 4.5 ratio — ABNORMAL HIGH (ref 0.0–4.4)
Cholesterol, Total: 249 mg/dL — ABNORMAL HIGH (ref 100–199)
HDL: 55 mg/dL (ref >39)
LDL Chol Calc (NIH): 171 mg/dL — ABNORMAL HIGH (ref 0–99)
Triglycerides: 129 mg/dL (ref 0–149)
VLDL Cholesterol Cal: 23 mg/dL (ref 5–40)

## 2023-04-14 LAB — COMPREHENSIVE METABOLIC PANEL
ALT: 19 IU/L (ref 0–32)
AST: 24 IU/L (ref 0–40)
Albumin/Globulin Ratio: 1.6 (ref 1.2–2.2)
Albumin: 4.7 g/dL (ref 3.9–4.9)
Alkaline Phosphatase: 99 IU/L (ref 44–121)
BUN/Creatinine Ratio: 21 (ref 9–23)
BUN: 16 mg/dL (ref 6–24)
Bilirubin Total: 0.3 mg/dL (ref 0.0–1.2)
CO2: 24 mmol/L (ref 20–29)
Calcium: 9.9 mg/dL (ref 8.7–10.2)
Chloride: 96 mmol/L (ref 96–106)
Creatinine, Ser: 0.75 mg/dL (ref 0.57–1.00)
Globulin, Total: 2.9 g/dL (ref 1.5–4.5)
Glucose: 106 mg/dL — ABNORMAL HIGH (ref 70–99)
Potassium: 4.1 mmol/L (ref 3.5–5.2)
Sodium: 137 mmol/L (ref 134–144)
Total Protein: 7.6 g/dL (ref 6.0–8.5)
eGFR: 97 mL/min/{1.73_m2} (ref >59)

## 2023-04-14 LAB — VITAMIN D 25 HYDROXY (VIT D DEFICIENCY, FRACTURES): Vit D, 25-Hydroxy: 18.8 ng/mL — ABNORMAL LOW (ref 30.0–100.0)

## 2023-04-14 LAB — TSH: TSH: 3.59 u[IU]/mL (ref 0.450–4.500)

## 2023-04-14 NOTE — Addendum Note (Signed)
Addended by: Larae Grooms on: 04/14/2023 08:03 AM   Modules accepted: Orders

## 2023-04-14 NOTE — Progress Notes (Signed)
Please let patient know that overall her lab work looks good.  Her glucose is slightly elevated so I have added on a measure of diabetes.  Once that returns I will let her know what it says.    Please let patient know that her cholesterol is elevated.  I recommend following a low fat diet and exercise.   Her vitamin D is low which is likely contributing to her fatigue.  I have sent in a prescription for vitamin D 50,000 IU once weekly for 12 weeks.  Once she completes this she should take Vitamin D 2000 IU once daily.  We will recheck this in the future.

## 2023-04-20 ENCOUNTER — Emergency Department
Admission: EM | Admit: 2023-04-20 | Discharge: 2023-04-20 | Disposition: A | Discharge status: Home / Self Care | Payer: BC Managed Care – PPO | Attending: Emergency Medicine | Admitting: Emergency Medicine

## 2023-04-20 DIAGNOSIS — T2692XA Corrosion of left eye and adnexa, part unspecified, initial encounter: Secondary | ICD-10-CM | POA: Diagnosis not present

## 2023-04-20 DIAGNOSIS — X58XXXA Exposure to other specified factors, initial encounter: Secondary | ICD-10-CM | POA: Insufficient documentation

## 2023-04-20 DIAGNOSIS — Y929 Unspecified place or not applicable: Secondary | ICD-10-CM | POA: Insufficient documentation

## 2023-04-20 DIAGNOSIS — T528X1A Toxic effect of other organic solvents, accidental (unintentional), initial encounter: Secondary | ICD-10-CM | POA: Diagnosis not present

## 2023-04-20 DIAGNOSIS — T65891A Toxic effect of other specified substances, accidental (unintentional), initial encounter: Secondary | ICD-10-CM | POA: Insufficient documentation

## 2023-04-20 DIAGNOSIS — S058X2A Other injuries of left eye and orbit, initial encounter: Secondary | ICD-10-CM | POA: Diagnosis not present

## 2023-04-20 MED ORDER — FLUORESCEIN SODIUM 1 MG OP STRP
1.0000 | ORAL_STRIP | Freq: Once | OPHTHALMIC | Status: AC
  Administered 2023-04-20: 1 via OPHTHALMIC
  Filled 2023-04-20: qty 1

## 2023-04-20 MED ORDER — TETRACAINE HCL 0.5 % OP SOLN
2.0000 [drp] | Freq: Once | OPHTHALMIC | Status: AC
  Administered 2023-04-20: 2 [drp] via OPHTHALMIC
  Filled 2023-04-20: qty 4

## 2023-04-20 MED ORDER — KETOROLAC TROMETHAMINE 0.5 % OP SOLN: 1.0000 [drp] | Freq: Four times a day (QID) | OPHTHALMIC | 0 refills | Status: DC

## 2023-04-20 MED ORDER — POLYMYXIN B-TRIMETHOPRIM 10000-0.1 UNIT/ML-% OP SOLN: 2.0000 [drp] | Freq: Four times a day (QID) | OPHTHALMIC | 0 refills | Status: DC

## 2023-04-20 NOTE — ED Provider Notes (Signed)
[facility] Provider Note  Patient Contact: 5:33 PM (approximate)   History   Foreign Body in Eye   HPI  Tara Bishop is a 52 y.o. female who presents emergency department complaining of pain in the left eye after potential superglue exposure to the left eye.  Patient states that there is a small amount of glue on her hand and she rubbed her eye got in her eye.  No visual changes.  She does not wear glasses or contacts.  No other injury or complaint at this time.     Physical Exam   Triage Vital Signs: ED Triage Vitals  Enc Vitals Group     BP 04/20/23 1713 135/81     Pulse Rate 04/20/23 1713 60     Resp 04/20/23 1713 16     Temp 04/20/23 1713 98.2 F (36.8 C)     Temp Source 04/20/23 1713 Oral     SpO2 04/20/23 1713 97 %     Weight 04/20/23 1717 174 lb 2.6 oz (79 kg)     Height --      Head Circumference --      Peak Flow --      Pain Score 04/20/23 1714 3     Pain Loc --      Pain Edu? --      Excl. in GC? --     Most recent vital signs: Vitals:   04/20/23 1713  BP: 135/81  Pulse: 60  Resp: 16  Temp: 98.2 F (36.8 C)  SpO2: 97%     General: Alert and in no acute distress. Eyes:  PERRL. EOMI. eye is anesthetized using tetracaine.  Fluorescein staining applied with area of uptake in the 6 o'clock position.  Superficial area of uptake, no evidence of retained foreign body or her pieces of dried glue.  Funduscopic exam bilaterally reveals red reflex, vasculature and optic discs is unremarkable bilaterally. Cardiovascular:  Good peripheral perfusion Respiratory: Normal respiratory effort without tachypnea or retractions. Lungs CTAB.  Musculoskeletal: Full range of motion to all extremities.  Neurologic:  No gross focal neurologic deficits are appreciated.  Skin:   No rash noted Other:   ED Results / Procedures / Treatments   Labs (all labs ordered are listed, but only abnormal results are displayed) Labs Reviewed - No data  to display   EKG     RADIOLOGY    No results found.  PROCEDURES:  Critical Care performed: No  Procedures   MEDICATIONS ORDERED IN ED: Medications  fluorescein ophthalmic strip 1 strip (1 strip Left Eye Given 04/20/23 1728)  tetracaine (PONTOCAINE) 0.5 % ophthalmic solution 2 drop (2 drops Left Eye Given 04/20/23 1728)     IMPRESSION / MDM / ASSESSMENT AND PLAN / ED COURSE  I reviewed the triage vital signs and the nursing notes.                                 Differential diagnosis includes, but is not limited to, corneal abrasion, corneal burn, conjunctivitis (chemical)  Patient's presentation is most consistent with acute presentation with potential threat to life or bodily function.   Patient's diagnosis is consistent with corneal abrasion.  Patient presents emergency department after getting some glue in her left eye.  Patient has a very superficial corneal abrasion noted.  No evidence of retained glue.  Glue is not involving the patient's eyelid, funduscopic exam was  reassuring.  Antibiotic eyedrops and Acular prescribed for the patient at this time.  Follow-up with primary care as needed.. Patient is given ED precautions to return to the ED for any worsening or new symptoms.     FINAL CLINICAL IMPRESSION(S) / ED DIAGNOSES   Final diagnoses:  Chemical injury of left eye, initial encounter     Rx / DC Orders   ED Discharge Orders          Ordered    ketorolac (ACULAR) 0.5 % ophthalmic solution  4 times daily        04/20/23 1737    trimethoprim-polymyxin b (POLYTRIM) ophthalmic solution  Every 6 hours        04/20/23 1737             Note:  This document was prepared using Dragon voice recognition software and may include unintentional dictation errors.   Lanette Hampshire 04/20/23 1739    Jene Every, MD 04/20/23 [phone]

## 2023-04-20 NOTE — ED Triage Notes (Signed)
Pt presents to the ED due to foreign body in eye. Pt states she believers she gpt glue in her L eye. Pt states she attempted to wish it out. Pty denies pain when blinking or vision change. Pt A&Ox4

## 2023-04-24 ENCOUNTER — Telehealth: Payer: Self-pay | Admitting: *Deleted

## 2023-04-24 ENCOUNTER — Other Ambulatory Visit: Payer: Self-pay | Admitting: *Deleted

## 2023-04-24 DIAGNOSIS — Z1211 Encounter for screening for malignant neoplasm of colon: Secondary | ICD-10-CM

## 2023-04-24 MED ORDER — NA SULFATE-K SULFATE-MG SULF 17.5-3.13-1.6 GM/177ML PO SOLN: 1.0000 | Freq: Once | ORAL | 0 refills | Status: AC

## 2023-04-24 NOTE — Telephone Encounter (Signed)
Gastroenterology Pre-Procedure Review  Request Date: 05/17/2023 Requesting Physician: Dr. Allegra Lai  PATIENT REVIEW QUESTIONS: The patient responded to the following health history questions as indicated:    1. Are you having any GI issues? no 2. Do you have a personal history of Polyps? no 3. Do you have a family history of Colon Cancer or Polyps? no 4. Diabetes Mellitus? no 5. Joint replacements in the past 12 months?no 6. Major health problems in the past 3 months?no 7. Any artificial heart valves, MVP, or defibrillator?no    MEDICATIONS & ALLERGIES:    Patient reports the following regarding taking any anticoagulation/antiplatelet therapy:   Plavix, Coumadin, Eliquis, Xarelto, Lovenox, Pradaxa, Brilinta, or Effient? no Aspirin? no  Patient confirms/reports the following medications:  Current Outpatient Medications  Medication Sig Dispense Refill   Na Sulfate-K Sulfate-Mg Sulf 17.5-3.13-1.6 GM/177ML SOLN Take 1 kit by mouth once for 1 dose. 354 mL 0   cyclobenzaprine (FLEXERIL) 10 MG tablet Take 1 tablet (10 mg total) by mouth at bedtime. 30 tablet 0   hydrochlorothiazide (HYDRODIURIL) 25 MG tablet Take 1 tablet (25 mg total) by mouth daily. 90 tablet 0   ketorolac (ACULAR) 0.5 % ophthalmic solution Place 1 drop into the right eye 4 (four) times daily. 5 mL 0   pantoprazole (PROTONIX) 40 MG tablet Take 1 tablet (40 mg total) by mouth daily for 14 days. 14 tablet 0   trimethoprim-polymyxin b (POLYTRIM) ophthalmic solution Place 2 drops into the left eye every 6 (six) hours. 10 mL 0   No current facility-administered medications for this visit.    Patient confirms/reports the following allergies:  No Known Allergies  No orders of the defined types were placed in this encounter.   AUTHORIZATION INFORMATION Primary Insurance: 1D#: Group #:  Secondary Insurance: 1D#: Group #:  SCHEDULE INFORMATION: Date: 05/17/2023   Time: Location:  [facility]

## 2023-05-10 ENCOUNTER — Encounter: Payer: Self-pay | Admitting: Gastroenterology

## 2023-05-11 ENCOUNTER — Telehealth: Payer: Self-pay

## 2023-05-11 NOTE — Telephone Encounter (Signed)
[phone] PT has question about procedure. Pt ask if you could call them to get clarification.

## 2023-05-11 NOTE — Telephone Encounter (Signed)
Spoken to patient's husband since he can speak [place]. He stated that he did not any question. Wanted to know the reason for the call and when will they know what time to be there on 05/17/2023.  Inform him the information. Asked if they received the instructions, he stated no, so I went ahead and send another one out today and left a copy at the front desk.  I have tried to go over the instructions but the call got cut off. Tried to call patient and patient 's husband but went to voicemail.

## 2023-05-12 ENCOUNTER — Ambulatory Visit (INDEPENDENT_AMBULATORY_CARE_PROVIDER_SITE_OTHER): Payer: BC Managed Care – PPO | Admitting: Nurse Practitioner

## 2023-05-12 ENCOUNTER — Encounter: Payer: Self-pay | Admitting: Nurse Practitioner

## 2023-05-12 VITALS — BP 113/69 | HR 65 | Temp 98.7°F | Wt 174.4 lb

## 2023-05-12 DIAGNOSIS — R7309 Other abnormal glucose: Secondary | ICD-10-CM | POA: Diagnosis not present

## 2023-05-12 DIAGNOSIS — E559 Vitamin D deficiency, unspecified: Secondary | ICD-10-CM | POA: Diagnosis not present

## 2023-05-12 DIAGNOSIS — I10 Essential (primary) hypertension: Secondary | ICD-10-CM | POA: Diagnosis not present

## 2023-05-12 MED ORDER — VALSARTAN 80 MG PO TABS: 80.0000 mg | ORAL_TABLET | Freq: Every day | ORAL | 0 refills | Status: DC

## 2023-05-12 NOTE — Assessment & Plan Note (Signed)
Patient endorses taking Vitamin D 50,000 IU once weekly.

## 2023-05-12 NOTE — Assessment & Plan Note (Signed)
Chronic.  Not well controlled.  Mostly elevated to 140-150s at home.  Will change from HCTZ to Valsartan.  Side effects and benefits of medication discussed during visit today. Follow up in 2 months.  Call sooner if concerns arise.

## 2023-05-12 NOTE — Progress Notes (Signed)
BP 113/69   Pulse 65   Temp 98.7 F (37.1 C) (Oral)   Wt 174 lb 6.4 oz (79.1 kg)   LMP  (LMP Unknown)   SpO2 98%   BMI 31.90 kg/m    Subjective:    Patient ID: Tara Bishop, female    DOB: Mar 14, 1971, 52 y.o.   MRN: [id number]  HPI: Tara Bishop is a 52 y.o. female  Chief Complaint  Patient presents with   Hypertension   Depression   Anxiety   HYPERTENSION / HYPERLIPIDEMIA Patient was recently seen in the ER for elevated diastolic blood pressures.  She was started on HCTZ.   Satisfied with current treatment? yes Duration of hypertension: years BP monitoring frequency: daily BP range: 117-155/80 BP medication side effects: no Past BP meds: HCTZ Duration of hyperlipidemia: years Cholesterol medication side effects: no Cholesterol supplements: none Past cholesterol medications: none Medication compliance: excellent compliance Aspirin: no Recent stressors: no Recurrent headaches: yes- worse with elevated blood pressure Visual changes: no Palpitations: no Dyspnea: no Chest pain: yes Lower extremity edema: no Dizzy/lightheaded: no   ELEVATED DEPRESSION SCREENING Patient states she does feel a little down and depressed.  She doesn't want to start medication today.  Would like to think about it over the next couple of weeks and re discuss at our follow up.  She would consider therapy at some point but would like to work on it on her own at this time.  Denies thoughts of self harm but does feel like she is a burden to others.      05/12/2023    9:29 AM 04/13/2023    8:36 AM 06/08/2020    2:50 PM  PHQ9 SCORE ONLY  PHQ-9 Total Score 2 15 1         05/12/2023    9:30 AM 04/13/2023    8:37 AM 06/08/2020    2:52 PM  GAD 7 : Generalized Anxiety Score  Nervous, Anxious, on Edge 0 1 0  Control/stop worrying 0 3 0  Worry too much - different things 0 2 0  Trouble relaxing 0 2 0  Restless 0 0 0  Easily annoyed or irritable 0 3 0  Afraid - awful might happen  0 2 0  Total GAD 7 Score 0 13 0  Anxiety Difficulty Not difficult at all Somewhat difficult      Relevant past medical, surgical, family and social history reviewed and updated as indicated. Interim medical history since our last visit reviewed. Allergies and medications reviewed and updated.  Review of Systems  Eyes:  Negative for visual disturbance.  Respiratory:  Negative for cough, chest tightness and shortness of breath.   Cardiovascular:  Negative for chest pain, palpitations and leg swelling.  Neurological:  Negative for dizziness and headaches.  Psychiatric/Behavioral:  Negative for dysphoric mood and suicidal ideas. The patient is not nervous/anxious.     Per HPI unless specifically indicated above     Objective:    BP 113/69   Pulse 65   Temp 98.7 F (37.1 C) (Oral)   Wt 174 lb 6.4 oz (79.1 kg)   LMP  (LMP Unknown)   SpO2 98%   BMI 31.90 kg/m   Wt Readings from Last 3 Encounters:  05/12/23 174 lb 6.4 oz (79.1 kg)  04/20/23 174 lb 2.6 oz (79 kg)  04/13/23 174 lb (78.9 kg)    Physical Exam Vitals and nursing note reviewed.  Constitutional:      General: She is  not in acute distress.    Appearance: Normal appearance. She is normal weight. She is not ill-appearing, toxic-appearing or diaphoretic.  HENT:     Head: Normocephalic.     Right Ear: External ear normal.     Left Ear: External ear normal.     Nose: Nose normal.     Mouth/Throat:     Mouth: Mucous membranes are moist.     Pharynx: Oropharynx is clear.  Eyes:     General:        Right eye: No discharge.        Left eye: No discharge.     Extraocular Movements: Extraocular movements intact.     Conjunctiva/sclera: Conjunctivae normal.     Pupils: Pupils are equal, round, and reactive to light.  Cardiovascular:     Rate and Rhythm: Normal rate and regular rhythm.     Heart sounds: No murmur heard. Pulmonary:     Effort: Pulmonary effort is normal. No respiratory distress.     Breath sounds:  Normal breath sounds. No wheezing or rales.  Musculoskeletal:     Cervical back: Normal range of motion and neck supple.  Skin:    General: Skin is warm and dry.     Capillary Refill: Capillary refill takes less than 2 seconds.  Neurological:     General: No focal deficit present.     Mental Status: She is alert and oriented to person, place, and time. Mental status is at baseline.  Psychiatric:        Mood and Affect: Mood normal.        Behavior: Behavior normal.        Thought Content: Thought content normal.        Judgment: Judgment normal.     Results for orders placed or performed in visit on 04/13/23  Microscopic Examination   Urine  Result Value Ref Range   WBC, UA None seen 0 - 5 /hpf   RBC, Urine 0-2 0 - 2 /hpf   Epithelial Cells (non renal) 0-10 0 - 10 /hpf   Bacteria, UA None seen None seen/Few  CBC with Differential/Platelet  Result Value Ref Range   WBC 11.0 (H) 3.4 - 10.8 x10E3/uL   RBC 5.08 3.77 - 5.28 x10E6/uL   Hemoglobin 14.8 11.1 - 15.9 g/dL   Hematocrit 21.3 08.6 - 46.6 %   MCV 90 79 - 97 fL   MCH 29.1 26.6 - 33.0 pg   MCHC 32.3 31.5 - 35.7 g/dL   RDW 57.8 46.9 - 62.9 %   Platelets 286 150 - 450 x10E3/uL   Neutrophils 63 Not Estab. %   Lymphs 28 Not Estab. %   Monocytes 7 Not Estab. %   Eos 1 Not Estab. %   Basos 1 Not Estab. %   Neutrophils Absolute 6.9 1.4 - 7.0 x10E3/uL   Lymphocytes Absolute 3.1 0.7 - 3.1 x10E3/uL   Monocytes Absolute 0.7 0.1 - 0.9 x10E3/uL   EOS (ABSOLUTE) 0.1 0.0 - 0.4 x10E3/uL   Basophils Absolute 0.1 0.0 - 0.2 x10E3/uL   Immature Granulocytes 0 Not Estab. %   Immature Grans (Abs) 0.0 0.0 - 0.1 x10E3/uL  Comprehensive metabolic panel  Result Value Ref Range   Glucose 106 (H) 70 - 99 mg/dL   BUN 16 6 - 24 mg/dL   Creatinine, Ser 5.28 0.57 - 1.00 mg/dL   eGFR 97 >41 LK/GMW/1.02   BUN/Creatinine Ratio 21 9 - 23   Sodium 137 134 - 144  mmol/L   Potassium 4.1 3.5 - 5.2 mmol/L   Chloride 96 96 - 106 mmol/L   CO2 24 20 -  29 mmol/L   Calcium 9.9 8.7 - 10.2 mg/dL   Total Protein 7.6 6.0 - 8.5 g/dL   Albumin 4.7 3.9 - 4.9 g/dL   Globulin, Total 2.9 1.5 - 4.5 g/dL   Albumin/Globulin Ratio 1.6 1.2 - 2.2   Bilirubin Total 0.3 0.0 - 1.2 mg/dL   Alkaline Phosphatase 99 44 - 121 IU/L   AST 24 0 - 40 IU/L   ALT 19 0 - 32 IU/L  Lipid panel  Result Value Ref Range   Cholesterol, Total 249 (H) 100 - 199 mg/dL   Triglycerides 161 0 - 149 mg/dL   HDL 55 >09 mg/dL   VLDL Cholesterol Cal 23 5 - 40 mg/dL   LDL Chol Calc (NIH) 604 (H) 0 - 99 mg/dL   Chol/HDL Ratio 4.5 (H) 0.0 - 4.4 ratio  TSH  Result Value Ref Range   TSH 3.590 0.450 - 4.500 uIU/mL  Urinalysis, Routine w reflex microscopic  Result Value Ref Range   Specific Gravity, UA 1.020 1.005 - 1.030   pH, UA 6.0 5.0 - 7.5   Color, UA Yellow Yellow   Appearance Ur Clear Clear   Leukocytes,UA Negative Negative   Protein,UA Negative Negative/Trace   Glucose, UA Negative Negative   Ketones, UA Negative Negative   RBC, UA Trace (A) Negative   Bilirubin, UA Negative Negative   Urobilinogen, Ur 0.2 0.2 - 1.0 mg/dL   Nitrite, UA Negative Negative   Microscopic Examination See below:   Vitamin D (25 hydroxy)  Result Value Ref Range   Vit D, 25-Hydroxy 18.8 (L) 30.0 - 100.0 ng/mL      Assessment & Plan:   Problem List Items Addressed This Visit       Cardiovascular and Mediastinum   Hypertension    Chronic.  Not well controlled.  Mostly elevated to 140-150s at home.  Will change from HCTZ to Valsartan.  Side effects and benefits of medication discussed during visit today. Follow up in 2 months.  Call sooner if concerns arise.       Relevant Medications   valsartan (DIOVAN) 80 MG tablet     Other   Vitamin D deficiency    Patient endorses taking Vitamin D 50,000 IU once weekly.       Other Visit Diagnoses     Elevated glucose    -  Primary   Labs ordered at visit today.  Will make recommendations based on lab results.   Relevant Orders   HgB  A1c        Follow up plan: Return in about 2 months (around 07/12/2023) for BP Check.

## 2023-05-13 LAB — HEMOGLOBIN A1C
Est. average glucose Bld gHb Est-mCnc: 137 mg/dL
Hgb A1c MFr Bld: 6.4 % — ABNORMAL HIGH (ref 4.8–5.6)

## 2023-05-16 NOTE — Progress Notes (Signed)
Please let patient know that her A1c shows that she is at the very top of the prediabetic range. She is almost diabetic.  I recommend she follow a low carb diet and exercise.

## 2023-05-17 ENCOUNTER — Telehealth: Payer: Self-pay | Admitting: *Deleted

## 2023-05-17 ENCOUNTER — Ambulatory Visit: Payer: BC Managed Care – PPO | Admitting: Anesthesiology

## 2023-05-17 ENCOUNTER — Ambulatory Visit
Admission: RE | Admit: 2023-05-17 | Discharge: 2023-05-17 | Disposition: A | Discharge status: Home / Self Care | Payer: BC Managed Care – PPO | Attending: Gastroenterology | Admitting: Gastroenterology

## 2023-05-17 ENCOUNTER — Encounter
Admission: RE | Disposition: A | Discharge status: Home / Self Care | Payer: Self-pay | Source: Home / Self Care | Attending: Gastroenterology

## 2023-05-17 ENCOUNTER — Encounter: Payer: Self-pay | Admitting: Gastroenterology

## 2023-05-17 ENCOUNTER — Other Ambulatory Visit: Payer: Self-pay

## 2023-05-17 ENCOUNTER — Telehealth: Payer: Self-pay

## 2023-05-17 DIAGNOSIS — Z1211 Encounter for screening for malignant neoplasm of colon: Secondary | ICD-10-CM

## 2023-05-17 HISTORY — PX: COLONOSCOPY WITH PROPOFOL: SHX5780

## 2023-05-17 SURGERY — COLONOSCOPY WITH PROPOFOL
Anesthesia: General

## 2023-05-17 MED ORDER — PROPOFOL 10 MG/ML IV BOLUS
INTRAVENOUS | Status: DC | PRN
  Administered 2023-05-17: 50 mg via INTRAVENOUS

## 2023-05-17 MED ORDER — LIDOCAINE HCL (CARDIAC) PF 100 MG/5ML IV SOSY
PREFILLED_SYRINGE | INTRAVENOUS | Status: DC | PRN
  Administered 2023-05-17: 80 mg via INTRAVENOUS

## 2023-05-17 MED ORDER — SODIUM CHLORIDE 0.9 % IV SOLN: INTRAVENOUS | Status: DC

## 2023-05-17 MED ORDER — PROPOFOL 500 MG/50ML IV EMUL
INTRAVENOUS | Status: DC | PRN
  Administered 2023-05-17: 125 ug/kg/min via INTRAVENOUS

## 2023-05-17 NOTE — Telephone Encounter (Signed)
Pt returned your call . Pt states she's unclear on what she needs to do.  [phone]

## 2023-05-17 NOTE — Op Note (Signed)
[facility] Gastroenterology Patient Name: Tara Bishop Procedure Date: 05/17/2023 9:09 AM MRN: [id number] Account #: [id number] Date of Birth: 1971/04/23 Admit Type: Outpatient Age: 52 Room: [facility] ENDO ROOM 4 Gender: Female Note Status: Finalized Instrument Name: Nelda Marseille [id number] Procedure:             Colonoscopy Indications:           Screening for colorectal malignant neoplasm, This is                         the patient's first colonoscopy Providers:             Toney Reil MD, MD Referring MD:          Larae Grooms (Referring MD) Medicines:             General Anesthesia Complications:         No immediate complications. Estimated blood loss: None. Procedure:             Pre-Anesthesia Assessment:                        - Prior to the procedure, a History and Physical was                         performed, and patient medications and allergies were                         reviewed. The patient is competent. The risks and                         benefits of the procedure and the sedation options and                         risks were discussed with the patient. All questions                         were answered and informed consent was obtained.                         Patient identification and proposed procedure were                         verified by the physician, the nurse, the                         anesthesiologist, the anesthetist and the technician                         in the pre-procedure area in the procedure room in the                         endoscopy suite. Mental Status Examination: alert and                         oriented. Airway Examination: normal oropharyngeal                         airway and neck mobility. Respiratory Examination:  clear to auscultation. CV Examination: normal.                         Prophylactic Antibiotics: The patient does not require                         prophylactic  antibiotics. Prior Anticoagulants: The                         patient has taken no anticoagulant or antiplatelet                         agents. ASA Grade Assessment: II - A patient with mild                         systemic disease. After reviewing the risks and                         benefits, the patient was deemed in satisfactory                         condition to undergo the procedure. The anesthesia                         plan was to use general anesthesia. Immediately prior                         to administration of medications, the patient was                         re-assessed for adequacy to receive sedatives. The                         heart rate, respiratory rate, oxygen saturations,                         blood pressure, adequacy of pulmonary ventilation, and                         response to care were monitored throughout the                         procedure. The physical status of the patient was                         re-assessed after the procedure.                        After obtaining informed consent, the colonoscope was                         passed under direct vision. Throughout the procedure,                         the patient's blood pressure, pulse, and oxygen                         saturations were monitored continuously. The  Colonoscope was introduced through the anus and                         advanced to the the cecum, identified by appendiceal                         orifice and ileocecal valve. The colonoscopy was                         performed without difficulty. The patient tolerated                         the procedure well. The quality of the bowel                         preparation was unsatisfactory. The ileocecal valve,                         appendiceal orifice, and rectum were photographed. Findings:      The perianal and digital rectal examinations were normal. Pertinent       negatives include  normal sphincter tone and no palpable rectal lesions.      Copious quantities of semi-liquid stool was found in the entire colon,       precluding visualization.      The retroflexed view of the distal rectum and anal verge was normal and       showed no anal or rectal abnormalities. Impression:            - Preparation of the colon was unsatisfactory.                        - Stool in the entire examined colon.                        - The distal rectum and anal verge are normal on                         retroflexion view.                        - No specimens collected. Recommendation:        - Discharge patient to home (with escort).                        - Resume previous diet today.                        - Continue present medications.                        - Repeat colonoscopy at next available appointment                         (within 3 months) with 2 day prep because the bowel                         preparation was suboptimal. Procedure Code(s):     --- Professional ---  J1914, Colorectal cancer screening; colonoscopy on                         individual not meeting criteria for high risk Diagnosis Code(s):     --- Professional ---                        Z12.11, Encounter for screening for malignant neoplasm                         of colon CPT copyright 2022 American Medical Association. All rights reserved. The codes documented in this report are preliminary and upon coder review may  be revised to meet current compliance requirements. Dr. Libby Maw Toney Reil MD, MD 05/17/2023 9:48:27 AM This report has been signed electronically. Number of Addenda: 0 Note Initiated On: 05/17/2023 9:09 AM Scope Withdrawal Time: 0 hours 6 minutes 16 seconds  Total Procedure Duration: 0 hours 8 minutes 47 seconds  Estimated Blood Loss:  Estimated blood loss: none.      [facility]

## 2023-05-17 NOTE — Anesthesia Preprocedure Evaluation (Signed)
Anesthesia Evaluation  Patient identified by MRN, date of birth, ID band Patient awake    Reviewed: Allergy & Precautions, H&P , NPO status , Patient's Chart, lab work & pertinent test results, reviewed documented beta blocker date and time   Airway Mallampati: II   Neck ROM: full    Dental  (+) Poor Dentition   Pulmonary neg pulmonary ROS   Pulmonary exam normal        Cardiovascular Exercise Tolerance: Good hypertension, On Medications Normal cardiovascular exam Rhythm:regular Rate:Normal     Neuro/Psych negative neurological ROS  negative psych ROS   GI/Hepatic negative GI ROS, Neg liver ROS,,,  Endo/Other  negative endocrine ROS    Renal/GU negative Renal ROS  negative genitourinary   Musculoskeletal   Abdominal   Peds  Hematology negative hematology ROS (+)   Anesthesia Other Findings Past Medical History: 06/08/2020: Acute pain of left knee Past Surgical History: 09/26/14: ABDOMINAL HYSTERECTOMY     Comment:  partial   Reproductive/Obstetrics negative OB ROS                             Anesthesia Physical Anesthesia Plan  ASA: 2  Anesthesia Plan: General   Post-op Pain Management:    Induction:   PONV Risk Score and Plan:   Airway Management Planned:   Additional Equipment:   Intra-op Plan:   Post-operative Plan:   Informed Consent: I have reviewed the patients History and Physical, chart, labs and discussed the procedure including the risks, benefits and alternatives for the proposed anesthesia with the patient or authorized representative who has indicated his/her understanding and acceptance.     Dental Advisory Given  Plan Discussed with: CRNA  Anesthesia Plan Comments:        Anesthesia Quick Evaluation

## 2023-05-17 NOTE — Telephone Encounter (Signed)
Also Morrie Sheldon have sent message regarding this patient from Dr Allegra Lai. Patient will need to schedule a repeat colonoscopy in 3 months and sample of prep solution will be in the front office in [facility] for patient to pick up pre Dr Allegra Lai.

## 2023-05-17 NOTE — Telephone Encounter (Signed)
Tired to call patient and her husband. Both numbers kept ringing.

## 2023-05-17 NOTE — H&P (Signed)
  Arlyss Repress, MD [address]  [address]  [place], [place] [address]  Main: [phone]  Fax: [phone] Pager: [phone]  Primary Care Physician:  Larae Grooms, NP Primary Gastroenterologist:  Dr. Arlyss Repress  Pre-Procedure History & Physical: HPI:  Tara Bishop is a 52 y.o. female is here for an colonoscopy.   Past Medical History:  Diagnosis Date   Acute pain of left knee 06/08/2020    Past Surgical History:  Procedure Laterality Date   ABDOMINAL HYSTERECTOMY  09/26/14   partial    Prior to Admission medications   Medication Sig Start Date End Date Taking? Authorizing Provider  valsartan (DIOVAN) 80 MG tablet Take 1 tablet (80 mg total) by mouth daily. 05/12/23  Yes Larae Grooms, NP  cyclobenzaprine (FLEXERIL) 10 MG tablet Take 1 tablet (10 mg total) by mouth at bedtime. 10/01/20   Gabriel Cirri, NP  ketorolac (ACULAR) 0.5 % ophthalmic solution Place 1 drop into the right eye 4 (four) times daily. 04/20/23   Cuthriell, Delorise Royals, PA-C  trimethoprim-polymyxin b (POLYTRIM) ophthalmic solution Place 2 drops into the left eye every 6 (six) hours. 04/20/23   Cuthriell, Delorise Royals, PA-C    Allergies as of 04/24/2023   (No Known Allergies)    Family History  Problem Relation Age of Onset   Cancer Mother     Social History   Socioeconomic History   Marital status: Married    Spouse name: Not on file   Number of children: Not on file   Years of education: Not on file   Highest education level: Not on file  Occupational History   Not on file  Tobacco Use   Smoking status: Never   Smokeless tobacco: Never  Vaping Use   Vaping Use: Never used  Substance and Sexual Activity   Alcohol use: No    Alcohol/week: 0.0 standard drinks of alcohol   Drug use: No   Sexual activity: Yes  Other Topics Concern   Not on file  Social History Narrative   Not on file   Social Determinants of Health   Financial Resource Strain: Not on file  Food  Insecurity: Not on file  Transportation Needs: Not on file  Physical Activity: Not on file  Stress: Not on file  Social Connections: Not on file  Intimate Partner Violence: Not on file    Review of Systems: See HPI, otherwise negative ROS  Physical Exam: BP 134/75   Pulse 64   Temp (!) 96.9 F (36.1 C) (Temporal)   Resp 16   Ht 5\' 2"  (1.575 m)   Wt 79.4 kg   LMP  (LMP Unknown)   SpO2 99%   BMI 32.01 kg/m  General:   Alert,  pleasant and cooperative in NAD Head:  Normocephalic and atraumatic. Neck:  Supple; no masses or thyromegaly. Lungs:  Clear throughout to auscultation.    Heart:  Regular rate and rhythm. Abdomen:  Soft, nontender and nondistended. Normal bowel sounds, without guarding, and without rebound.   Neurologic:  Alert and  oriented x4;  grossly normal neurologically.  Impression/Plan: Tara Bishop is here for a colonoscopy to be performed for colon cancer screening  Risks, benefits, limitations, and alternatives regarding  colonoscopy have been reviewed with the patient.  Questions have been answered.  All parties agreeable.   Lannette Donath, MD  05/17/2023, 9:18 AM

## 2023-05-17 NOTE — Telephone Encounter (Signed)
Tried to call patient but the phone kept ringing then it went to call cannot be completed.

## 2023-05-17 NOTE — Telephone Encounter (Signed)
-----   Message from Denman George, CMA sent at 05/17/2023 10:49 AM EDT ----- Regarding: FW: repeat colonoscopy  ----- Message ----- From: Toney Reil, MD Sent: 05/17/2023  10:13 AM EDT To: Denman George, CMA Subject: repeat colonoscopy                             Tara Bishop  Apparently, this patient drank only 1 bottle out of 2 and she is not clean.  Please repeat her colonoscopy within the next 3 months and she is agreeable.  She will need to pick up a sample kit from our office.  I told her to drink the second bottle along with the new kit for bowel prep  She was to be called in the morning, she works second shift  Thanks RV

## 2023-05-17 NOTE — Transfer of Care (Signed)
Immediate Anesthesia Transfer of Care Note  Patient: Tara Bishop  Procedure(s) Performed: COLONOSCOPY WITH PROPOFOL  Patient Location: PACU  Anesthesia Type:General  Level of Consciousness: sedated  Airway & Oxygen Therapy: Patient Spontanous Breathing  Post-op Assessment: Report given to RN and Post -op Vital signs reviewed and stable  Post vital signs: Reviewed and stable  Last Vitals:  Vitals Value Taken Time  BP 106/67 05/17/23 0948  Temp 36.1 C 05/17/23 0947  Pulse 71 05/17/23 0949  Resp 18 05/17/23 0949  SpO2 98 % 05/17/23 0949  Vitals shown include unvalidated device data.  Last Pain:  Vitals:   05/17/23 0947  TempSrc: Temporal  PainSc: 0-No pain         Complications: No notable events documented.

## 2023-05-17 NOTE — Anesthesia Postprocedure Evaluation (Signed)
Anesthesia Post Note  Patient: SAMEERA SPRUNK  Procedure(s) Performed: COLONOSCOPY WITH PROPOFOL  Patient location during evaluation: PACU Anesthesia Type: General Level of consciousness: awake and alert Pain management: pain level controlled Vital Signs Assessment: post-procedure vital signs reviewed and stable Respiratory status: spontaneous breathing, nonlabored ventilation, respiratory function stable and patient connected to nasal cannula oxygen Cardiovascular status: blood pressure returned to baseline and stable Postop Assessment: no apparent nausea or vomiting Anesthetic complications: no   No notable events documented.   Last Vitals:  Vitals:   05/17/23 0947 05/17/23 0957  BP:  104/76  Pulse:    Resp:    Temp: (!) 36.1 C   SpO2:      Last Pain:  Vitals:   05/17/23 0957  TempSrc:   PainSc: 0-No pain                 Yevette Edwards

## 2023-05-18 ENCOUNTER — Encounter: Payer: Self-pay | Admitting: Gastroenterology

## 2023-05-19 NOTE — Telephone Encounter (Signed)
Tried to call patient but the phone kept ringing then it went to call cannot be completed. 

## 2023-05-24 ENCOUNTER — Ambulatory Visit
Admission: RE | Admit: 2023-05-24 | Discharge: 2023-05-24 | Disposition: A | Discharge status: Home / Self Care | Payer: BC Managed Care – PPO | Source: Ambulatory Visit | Attending: Nurse Practitioner | Admitting: Nurse Practitioner

## 2023-05-24 DIAGNOSIS — Z1231 Encounter for screening mammogram for malignant neoplasm of breast: Secondary | ICD-10-CM | POA: Diagnosis not present

## 2023-05-25 NOTE — Progress Notes (Signed)
Please let patient know her Mammogram did not show any evidence of a malignancy.  The recommendation is to repeat the Mammogram in 1 year.  

## 2023-07-03 ENCOUNTER — Other Ambulatory Visit: Payer: Self-pay | Admitting: Nurse Practitioner

## 2023-07-04 NOTE — Telephone Encounter (Signed)
Requested Prescriptions  Pending Prescriptions Disp Refills   valsartan (DIOVAN) 80 MG tablet [Pharmacy Med Name: VALSARTAN 80 MG TABLET] 30 tablet 0    Sig: TAKE 1 TABLET BY MOUTH EVERY DAY     Cardiovascular:  Angiotensin Receptor Blockers Passed - 07/03/2023  2:30 PM      Passed - Cr in normal range and within 180 days    Creatinine  Date Value Ref Range Status  09/27/2014 0.73 0.60 - 1.30 mg/dL Final   Creatinine, Ser  Date Value Ref Range Status  04/13/2023 0.75 0.57 - 1.00 mg/dL Final         Passed - K in normal range and within 180 days    Potassium  Date Value Ref Range Status  04/13/2023 4.1 3.5 - 5.2 mmol/L Final  09/27/2014 3.4 (L) 3.5 - 5.1 mmol/L Final         Passed - Patient is not pregnant      Passed - Last BP in normal range    BP Readings from Last 1 Encounters:  05/17/23 104/76         Passed - Valid encounter within last 6 months    Recent Outpatient Visits           1 month ago Elevated glucose   [place] [facility] Tara Grooms, NP   2 months ago Annual physical exam   [place] [facility] Tara Grooms, NP   2 years ago Right-sided thoracic back pain, unspecified chronicity   [place] [facility] Tara Cirri, NP   2 years ago Acute right-sided thoracic back pain   [place] [facility] Tara Nose, NP   3 years ago Annual physical exam   [place] [facility] T[facility] Nose, NP       Future Appointments             In 1 week Tara Bishop, Tara Needs, NP [place] [facility]

## 2023-07-13 ENCOUNTER — Ambulatory Visit: Payer: BC Managed Care – PPO | Admitting: Family Medicine

## 2023-07-27 ENCOUNTER — Other Ambulatory Visit: Payer: Self-pay | Admitting: Nurse Practitioner

## 2023-07-28 NOTE — Telephone Encounter (Signed)
Requested Prescriptions  Pending Prescriptions Disp Refills   valsartan (DIOVAN) 80 MG tablet [Pharmacy Med Name: VALSARTAN 80 MG TABLET] 90 tablet 0    Sig: TAKE 1 TABLET BY MOUTH EVERY DAY     Cardiovascular:  Angiotensin Receptor Blockers Passed - 07/27/2023  8:30 AM      Passed - Cr in normal range and within 180 days    Creatinine  Date Value Ref Range Status  09/27/2014 0.73 0.60 - 1.30 mg/dL Final   Creatinine, Ser  Date Value Ref Range Status  04/13/2023 0.75 0.57 - 1.00 mg/dL Final         Passed - K in normal range and within 180 days    Potassium  Date Value Ref Range Status  04/13/2023 4.1 3.5 - 5.2 mmol/L Final  09/27/2014 3.4 (L) 3.5 - 5.1 mmol/L Final         Passed - Patient is not pregnant      Passed - Last BP in normal range    BP Readings from Last 1 Encounters:  05/17/23 104/76         Passed - Valid encounter within last 6 months    Recent Outpatient Visits           2 months ago Elevated glucose   [place] [facility] Larae Grooms, NP   3 months ago Annual physical exam   [place] [facility] Larae Grooms, NP   2 years ago Right-sided thoracic back pain, unspecified chronicity   [place] [facility] Gabriel Cirri, NP   3 years ago Acute right-sided thoracic back pain   [place] [facility] Valentino Nose, NP   3 years ago Annual physical exam   [place] [facility] Valentino Nose, NP

## 2023-08-03 ENCOUNTER — Encounter: Payer: Self-pay | Admitting: Family Medicine

## 2023-08-03 ENCOUNTER — Ambulatory Visit: Payer: BC Managed Care – PPO | Admitting: Family Medicine

## 2023-08-03 VITALS — BP 124/79 | HR 56 | Temp 97.9°F | Wt 173.6 lb

## 2023-08-03 DIAGNOSIS — I10 Essential (primary) hypertension: Secondary | ICD-10-CM

## 2023-08-03 DIAGNOSIS — R7309 Other abnormal glucose: Secondary | ICD-10-CM | POA: Diagnosis not present

## 2023-08-03 DIAGNOSIS — R7303 Prediabetes: Secondary | ICD-10-CM

## 2023-08-03 LAB — BAYER DCA HB A1C WAIVED: HB A1C (BAYER DCA - WAIVED): 6.2 % — ABNORMAL HIGH (ref 4.8–5.6)

## 2023-08-03 MED ORDER — VALSARTAN 80 MG PO TABS: 80.0000 mg | ORAL_TABLET | Freq: Every day | ORAL | 0 refills | Status: DC

## 2023-08-03 NOTE — Assessment & Plan Note (Addendum)
Chronic.well controlled. BP ranges from 125-130s/79-80s at home. Continue Valsartan 80 mg daily. Follow up in 3 months. Call sooner if concerns arise.

## 2023-08-03 NOTE — Patient Instructions (Signed)
Exercise 150 minutes weekly Low carbohydrate diet. Continue checking blood pressures and write them down

## 2023-08-03 NOTE — Progress Notes (Signed)
BP 124/79   Pulse (!) 56   Temp 97.9 F (36.6 C) (Oral)   Wt 173 lb 9.6 oz (78.7 kg)   LMP  (LMP Unknown)   SpO2 98%   BMI 31.75 kg/m    Subjective:    Patient ID: Tara Bishop, female    DOB: [birth year], 52 y.o.   MRN: [id number]  HPI: Tara Bishop is a 52 y.o. female  Chief Complaint  Patient presents with   Hypertension   HYPERTENSION without Chronic Kidney Disease She is only taking Valsartan 80 mg Hypertension status: stable  Satisfied with current treatment? Yes Duration of hypertension: Chronic BP monitoring frequency:  a few times a week BP range: 125-130/79-80.  BP medication side effects:  no Medication compliance: excellent compliance Aspirin: no Recurrent headaches: no Visual changes: no Palpitations: no Dyspnea: no Chest pain: no Lower extremity edema: no Dizzy/lightheaded: no   Relevant past medical, surgical, family and social history reviewed and updated as indicated. Interim medical history since our last visit reviewed. Allergies and medications reviewed and updated.  Review of Systems  Eyes:  Negative for visual disturbance.  Respiratory:  Negative for shortness of breath.   Cardiovascular:  Negative for chest pain, palpitations and leg swelling.  Neurological:  Negative for dizziness, light-headedness and headaches.   Per HPI unless specifically indicated above     Objective:    BP 124/79   Pulse (!) 56   Temp 97.9 F (36.6 C) (Oral)   Wt 173 lb 9.6 oz (78.7 kg)   LMP  (LMP Unknown)   SpO2 98%   BMI 31.75 kg/m   Wt Readings from Last 3 Encounters:  08/03/23 173 lb 9.6 oz (78.7 kg)  05/17/23 175 lb (79.4 kg)  05/12/23 174 lb 6.4 oz (79.1 kg)    Physical Exam Vitals and nursing note reviewed.  Constitutional:      General: She is awake. She is not in acute distress.    Appearance: Normal appearance. She is well-developed and well-groomed. She is obese. She is not ill-appearing.  HENT:     Head: Normocephalic and  atraumatic.     Right Ear: Hearing and external ear normal. No drainage.     Left Ear: Hearing and external ear normal. No drainage.     Nose: Nose normal.  Eyes:     General: Lids are normal.        Right eye: No discharge.        Left eye: No discharge.     Conjunctiva/sclera: Conjunctivae normal.  Cardiovascular:     Rate and Rhythm: Regular rhythm. Bradycardia present.     Pulses:          Radial pulses are 2+ on the right side and 2+ on the left side.       Posterior tibial pulses are 2+ on the right side and 2+ on the left side.     Heart sounds: Normal heart sounds, S1 normal and S2 normal. No murmur heard.    No gallop.  Pulmonary:     Effort: Pulmonary effort is normal. No accessory muscle usage or respiratory distress.     Breath sounds: Normal breath sounds.  Musculoskeletal:        General: Normal range of motion.     Cervical back: Full passive range of motion without pain and normal range of motion.     Right lower leg: No edema.     Left lower leg: No edema.  Skin:  General: Skin is warm and dry.     Capillary Refill: Capillary refill takes less than 2 seconds.  Neurological:     Mental Status: She is alert and oriented to person, place, and time.  Psychiatric:        Attention and Perception: Attention normal.        Mood and Affect: Mood normal.        Speech: Speech normal.        Behavior: Behavior normal. Behavior is cooperative.        Thought Content: Thought content normal.     Results for orders placed or performed in visit on 08/03/23  Bayer DCA Hb A1c Waived  Result Value Ref Range   HB A1C (BAYER DCA - WAIVED) 6.2 (H) 4.8 - 5.6 %      Assessment & Plan:   Problem List Items Addressed This Visit     Hypertension - Primary    Chronic.well controlled. BP ranges from 125-130s/79-80s at home. Continue Valsartan 80 mg daily. Follow up in 3 months. Call sooner if concerns arise.       Relevant Medications   valsartan (DIOVAN) 80 MG tablet    Other Visit Diagnoses     Prediabetes       Chronic, stable. Diet controlled. A1C down from 6.4% to 6.2% today. Return in 3 months, call sooner if concerns arise.   Relevant Orders   Bayer DCA Hb A1c Waived (Completed)        Follow up plan: Return in about 3 months (around 11/03/2023) for Follow up prediabetes and HTN.

## 2023-09-30 ENCOUNTER — Other Ambulatory Visit: Payer: Self-pay | Admitting: Nurse Practitioner

## 2023-10-02 NOTE — Telephone Encounter (Signed)
Requested Prescriptions  Refused Prescriptions Disp Refills   hydrochlorothiazide (HYDRODIURIL) 25 MG tablet [Pharmacy Med Name: HYDROCHLOROTHIAZIDE 25 MG TAB] 90 tablet 0    Sig: TAKE 1 TABLET (25 MG TOTAL) BY MOUTH DAILY.     Cardiovascular: Diuretics - Thiazide Passed - 09/30/2023  8:21 AM      Passed - Cr in normal range and within 180 days    Creatinine  Date Value Ref Range Status  09/27/2014 0.73 0.60 - 1.30 mg/dL Final   Creatinine, Ser  Date Value Ref Range Status  04/13/2023 0.75 0.57 - 1.00 mg/dL Final         Passed - K in normal range and within 180 days    Potassium  Date Value Ref Range Status  04/13/2023 4.1 3.5 - 5.2 mmol/L Final  09/27/2014 3.4 (L) 3.5 - 5.1 mmol/L Final         Passed - Na in normal range and within 180 days    Sodium  Date Value Ref Range Status  04/13/2023 137 134 - 144 mmol/L Final  09/27/2014 142 136 - 145 mmol/L Final         Passed - Last BP in normal range    BP Readings from Last 1 Encounters:  08/03/23 124/79         Passed - Valid encounter within last 6 months    Recent Outpatient Visits           2 months ago Primary hypertension   [place] [facility], Sherran Needs, NP   4 months ago Elevated glucose   [place] [facility] Larae Grooms, NP   5 months ago Annual physical exam   [place] [facility] Larae Grooms, NP   3 years ago Right-sided thoracic back pain, unspecified chronicity   [place] [facility] Gabriel Cirri, NP   3 years ago Acute right-sided thoracic back pain   [place] [facility] Valentino Nose, NP       Future Appointments             In 1 month Larae Grooms, NP [place] [facility]

## 2023-10-30 ENCOUNTER — Other Ambulatory Visit: Payer: Self-pay | Admitting: Nurse Practitioner

## 2023-10-31 NOTE — Telephone Encounter (Signed)
Requested Prescriptions  Pending Prescriptions Disp Refills   valsartan (DIOVAN) 80 MG tablet [Pharmacy Med Name: VALSARTAN 80 MG TABLET] 90 tablet 0    Sig: TAKE 1 TABLET BY MOUTH EVERY DAY     Cardiovascular:  Angiotensin Receptor Blockers Failed - 10/30/2023  2:03 AM      Failed - Cr in normal range and within 180 days    Creatinine  Date Value Ref Range Status  09/27/2014 0.73 0.60 - 1.30 mg/dL Final   Creatinine, Ser  Date Value Ref Range Status  04/13/2023 0.75 0.57 - 1.00 mg/dL Final         Failed - K in normal range and within 180 days    Potassium  Date Value Ref Range Status  04/13/2023 4.1 3.5 - 5.2 mmol/L Final  09/27/2014 3.4 (L) 3.5 - 5.1 mmol/L Final         Passed - Patient is not pregnant      Passed - Last BP in normal range    BP Readings from Last 1 Encounters:  08/03/23 124/79         Passed - Valid encounter within last 6 months    Recent Outpatient Visits           2 months ago Primary hypertension   [place] [facility] [place], Sherran Needs, NP   5 months ago Elevated glucose   [place] [facility] Larae Grooms, NP   6 months ago Annual physical exam   [place] [facility] Larae Grooms, NP   3 years ago Right-sided thoracic back pain, unspecified chronicity   [place] [facility] Gabriel Cirri, NP   3 years ago Acute right-sided thoracic back pain   [place] [facility] Valentino Nose, NP       Future Appointments             In 1 week Larae Grooms, NP  [facility]

## 2023-11-07 ENCOUNTER — Encounter: Payer: Self-pay | Admitting: Nurse Practitioner

## 2023-11-07 ENCOUNTER — Ambulatory Visit (INDEPENDENT_AMBULATORY_CARE_PROVIDER_SITE_OTHER): Payer: BC Managed Care – PPO | Admitting: Nurse Practitioner

## 2023-11-07 VITALS — BP 132/81 | HR 63 | Temp 98.0°F | Ht 62.0 in | Wt 179.4 lb

## 2023-11-07 DIAGNOSIS — E559 Vitamin D deficiency, unspecified: Secondary | ICD-10-CM

## 2023-11-07 DIAGNOSIS — E7800 Pure hypercholesterolemia, unspecified: Secondary | ICD-10-CM | POA: Diagnosis not present

## 2023-11-07 DIAGNOSIS — I10 Essential (primary) hypertension: Secondary | ICD-10-CM

## 2023-11-07 DIAGNOSIS — R7303 Prediabetes: Secondary | ICD-10-CM | POA: Insufficient documentation

## 2023-11-07 DIAGNOSIS — Z23 Encounter for immunization: Secondary | ICD-10-CM | POA: Diagnosis not present

## 2023-11-07 MED ORDER — VALSARTAN 80 MG PO TABS: 80.0000 mg | ORAL_TABLET | Freq: Every day | ORAL | 1 refills | Status: AC

## 2023-11-07 NOTE — Assessment & Plan Note (Signed)
Chronic.  Controlled.  Continue with current medication regimen of Valsartan '80mg'$  daily.  Refills sent today.  Labs ordered today.  Return to clinic in 6 months for reevaluation.  Call sooner if concerns arise.

## 2023-11-07 NOTE — Assessment & Plan Note (Signed)
Labs ordered at visit today.  Will make recommendations based on lab results.   

## 2023-11-07 NOTE — Assessment & Plan Note (Signed)
Chronic.  Controlled.  Last A1c was 6.2%.  Continue with a low carb diet.  Labs ordered today.  Follow up in 6 months.  Call sooner if concerns arise.

## 2023-11-07 NOTE — Progress Notes (Signed)
BP 132/81 (BP Location: Left Arm, Patient Position: Sitting, Cuff Size: Normal)   Pulse 63   Temp 98 F (36.7 C) (Oral)   Ht 5\' 2"  (1.575 m)   Wt 179 lb 6.4 oz (81.4 kg)   LMP  (LMP Unknown)   SpO2 97%   BMI 32.81 kg/m    Subjective:    Patient ID: Tara Bishop, female    DOB: [birth year], 52 y.o.   MRN: [id number]  HPI: Tara Bishop is a 52 y.o. female  Chief Complaint  Patient presents with   3 MONTH FOLLOW UP    Prediabetes, no concerns today    Blood Pressure Check   HYPERTENSION / HYPERLIPIDEMIA Satisfied with current treatment? yes Duration of hypertension: years BP monitoring frequency: daily BP range: 120/85 BP medication side effects: no Past BP meds: valsartan Duration of hyperlipidemia: years Cholesterol medication side effects: no Cholesterol supplements: none Past cholesterol medications: none Medication compliance: excellent compliance Aspirin: no Recent stressors: no Recurrent headaches: no Visual changes: no Palpitations: no Dyspnea: no Chest pain: no Lower extremity edema: no Dizzy/lightheaded: no  Relevant past medical, surgical, family and social history reviewed and updated as indicated. Interim medical history since our last visit reviewed. Allergies and medications reviewed and updated.  Review of Systems  Eyes:  Negative for visual disturbance.  Respiratory:  Negative for cough, chest tightness and shortness of breath.   Cardiovascular:  Negative for chest pain, palpitations and leg swelling.  Neurological:  Negative for dizziness and headaches.    Per HPI unless specifically indicated above     Objective:    BP 132/81 (BP Location: Left Arm, Patient Position: Sitting, Cuff Size: Normal)   Pulse 63   Temp 98 F (36.7 C) (Oral)   Ht 5\' 2"  (1.575 m)   Wt 179 lb 6.4 oz (81.4 kg)   LMP  (LMP Unknown)   SpO2 97%   BMI 32.81 kg/m   Wt Readings from Last 3 Encounters:  11/07/23 179 lb 6.4 oz (81.4 kg)  08/03/23 173  lb 9.6 oz (78.7 kg)  05/17/23 175 lb (79.4 kg)    Physical Exam Vitals and nursing note reviewed.  Constitutional:      General: She is not in acute distress.    Appearance: Normal appearance. She is normal weight. She is not ill-appearing, toxic-appearing or diaphoretic.  HENT:     Head: Normocephalic.     Right Ear: External ear normal.     Left Ear: External ear normal.     Nose: Nose normal.     Mouth/Throat:     Mouth: Mucous membranes are moist.     Pharynx: Oropharynx is clear.  Eyes:     General:        Right eye: No discharge.        Left eye: No discharge.     Extraocular Movements: Extraocular movements intact.     Conjunctiva/sclera: Conjunctivae normal.     Pupils: Pupils are equal, round, and reactive to light.  Cardiovascular:     Rate and Rhythm: Normal rate and regular rhythm.     Heart sounds: No murmur heard. Pulmonary:     Effort: Pulmonary effort is normal. No respiratory distress.     Breath sounds: Normal breath sounds. No wheezing or rales.  Musculoskeletal:     Cervical back: Normal range of motion and neck supple.  Skin:    General: Skin is warm and dry.     Capillary Refill: Capillary  refill takes less than 2 seconds.  Neurological:     General: No focal deficit present.     Mental Status: She is alert and oriented to person, place, and time. Mental status is at baseline.  Psychiatric:        Mood and Affect: Mood normal.        Behavior: Behavior normal.        Thought Content: Thought content normal.        Judgment: Judgment normal.     Results for orders placed or performed in visit on 08/03/23  Bayer DCA Hb A1c Waived  Result Value Ref Range   HB A1C (BAYER DCA - WAIVED) 6.2 (H) 4.8 - 5.6 %      Assessment & Plan:   Problem List Items Addressed This Visit       Cardiovascular and Mediastinum   Hypertension    Chronic.  Controlled.  Continue with current medication regimen of Valsartan 80mg  daily.  Refills sent today.  Labs  ordered today.  Return to clinic in 6 months for reevaluation.  Call sooner if concerns arise.        Relevant Medications   valsartan (DIOVAN) 80 MG tablet   Other Relevant Orders   Comp Met (CMET)     Other   Vitamin D deficiency - Primary    Labs ordered at visit today.  Will make recommendations based on lab results.        Relevant Orders   Vitamin D (25 hydroxy)   Hypercholesterolemia    Labs ordered at visit today.  Will make recommendations based on lab results.        Relevant Medications   valsartan (DIOVAN) 80 MG tablet   Other Relevant Orders   Lipid Profile   Prediabetes    Chronic.  Controlled.  Last A1c was 6.2%.  Continue with a low carb diet.  Labs ordered today.  Follow up in 6 months.  Call sooner if concerns arise.       Relevant Orders   HgB A1c   Other Visit Diagnoses     Need for influenza vaccination       Relevant Orders   Flu vaccine trivalent PF, 6mos and older(Flulaval,Afluria,Fluarix,Fluzone)        Follow up plan: Return in about 6 months (around 05/06/2024) for Physical and Fasting labs.

## 2023-11-08 LAB — HEMOGLOBIN A1C
Est. average glucose Bld gHb Est-mCnc: 146 mg/dL
Hgb A1c MFr Bld: 6.7 % — ABNORMAL HIGH (ref 4.8–5.6)

## 2023-11-08 LAB — COMPREHENSIVE METABOLIC PANEL
ALT: 21 [IU]/L (ref 0–32)
AST: 26 [IU]/L (ref 0–40)
Albumin: 4.2 g/dL (ref 3.9–4.9)
Alkaline Phosphatase: 79 [IU]/L (ref 44–121)
BUN/Creatinine Ratio: 19 (ref 9–23)
BUN: 14 mg/dL (ref 6–24)
Bilirubin Total: 0.4 mg/dL (ref 0.0–1.2)
CO2: 23 mmol/L (ref 20–29)
Calcium: 9.2 mg/dL (ref 8.7–10.2)
Chloride: 102 mmol/L (ref 96–106)
Creatinine, Ser: 0.74 mg/dL (ref 0.57–1.00)
Globulin, Total: 2.3 g/dL (ref 1.5–4.5)
Glucose: 111 mg/dL — ABNORMAL HIGH (ref 70–99)
Potassium: 4.6 mmol/L (ref 3.5–5.2)
Sodium: 137 mmol/L (ref 134–144)
Total Protein: 6.5 g/dL (ref 6.0–8.5)
eGFR: 99 mL/min/{1.73_m2} (ref >59)

## 2023-11-08 LAB — VITAMIN D 25 HYDROXY (VIT D DEFICIENCY, FRACTURES): Vit D, 25-Hydroxy: 36 ng/mL (ref 30.0–100.0)

## 2023-11-08 LAB — LIPID PANEL
Chol/HDL Ratio: 4 ratio (ref 0.0–4.4)
Cholesterol, Total: 197 mg/dL (ref 100–199)
HDL: 49 mg/dL (ref >39)
LDL Chol Calc (NIH): 125 mg/dL — ABNORMAL HIGH (ref 0–99)
Triglycerides: 128 mg/dL (ref 0–149)
VLDL Cholesterol Cal: 23 mg/dL (ref 5–40)

## 2023-11-09 NOTE — Progress Notes (Signed)
BP (!) 138/40   Pulse 62   Temp 97.7 F (36.5 C) (Oral)   Ht 5\' 2"  (1.575 m)   Wt 178 lb 9.6 oz (81 kg)   LMP  (LMP Unknown)   SpO2 97%   BMI 32.67 kg/m    Subjective:    Patient ID: Tara Bishop, female    DOB: 1971/05/28, 52 y.o.   MRN: [id number]  HPI: Tara Bishop is a 52 y.o. female  Chief Complaint  Patient presents with   Follow-up   Diabetes    Would like to have A1C checked to verify diabetes    DIABETES New diagnosis.  Patient is here to discuss today.  A1c was 6.7%.  She denies any polyuria, polydipsia, vision changes, and chest pain.  She has been checking her sugars at home and they are running less than 110.  Relevant past medical, surgical, family and social history reviewed and updated as indicated. Interim medical history since our last visit reviewed. Allergies and medications reviewed and updated.  Review of Systems  Eyes:  Negative for visual disturbance.  Cardiovascular:  Negative for chest pain.  Endocrine: Negative for polydipsia and polyuria.  Neurological:  Negative for numbness.    Per HPI unless specifically indicated above     Objective:    BP (!) 138/40   Pulse 62   Temp 97.7 F (36.5 C) (Oral)   Ht 5\' 2"  (1.575 m)   Wt 178 lb 9.6 oz (81 kg)   LMP  (LMP Unknown)   SpO2 97%   BMI 32.67 kg/m   Wt Readings from Last 3 Encounters:  11/10/23 178 lb 9.6 oz (81 kg)  11/07/23 179 lb 6.4 oz (81.4 kg)  08/03/23 173 lb 9.6 oz (78.7 kg)    Physical Exam Vitals and nursing note reviewed.  Constitutional:      General: She is not in acute distress.    Appearance: Normal appearance. She is obese. She is not ill-appearing, toxic-appearing or diaphoretic.  HENT:     Head: Normocephalic.     Right Ear: External ear normal.     Left Ear: External ear normal.     Nose: Nose normal.     Mouth/Throat:     Mouth: Mucous membranes are moist.     Pharynx: Oropharynx is clear.  Eyes:     General:        Right eye: No discharge.         Left eye: No discharge.     Extraocular Movements: Extraocular movements intact.     Conjunctiva/sclera: Conjunctivae normal.     Pupils: Pupils are equal, round, and reactive to light.  Cardiovascular:     Rate and Rhythm: Normal rate and regular rhythm.     Heart sounds: No murmur heard. Pulmonary:     Effort: Pulmonary effort is normal. No respiratory distress.     Breath sounds: Normal breath sounds. No wheezing or rales.  Musculoskeletal:     Cervical back: Normal range of motion and neck supple.  Skin:    General: Skin is warm and dry.     Capillary Refill: Capillary refill takes less than 2 seconds.  Neurological:     General: No focal deficit present.     Mental Status: She is alert and oriented to person, place, and time. Mental status is at baseline.  Psychiatric:        Mood and Affect: Mood normal.        Behavior:  Behavior normal.        Thought Content: Thought content normal.        Judgment: Judgment normal.     Results for orders placed or performed in visit on 11/07/23  Comp Met (CMET)  Result Value Ref Range   Glucose 111 (H) 70 - 99 mg/dL   BUN 14 6 - 24 mg/dL   Creatinine, Ser 3.29 0.57 - 1.00 mg/dL   eGFR 99 >51 OA/CZY/6.06   BUN/Creatinine Ratio 19 9 - 23   Sodium 137 134 - 144 mmol/L   Potassium 4.6 3.5 - 5.2 mmol/L   Chloride 102 96 - 106 mmol/L   CO2 23 20 - 29 mmol/L   Calcium 9.2 8.7 - 10.2 mg/dL   Total Protein 6.5 6.0 - 8.5 g/dL   Albumin 4.2 3.9 - 4.9 g/dL   Globulin, Total 2.3 1.5 - 4.5 g/dL   Bilirubin Total 0.4 0.0 - 1.2 mg/dL   Alkaline Phosphatase 79 44 - 121 IU/L   AST 26 0 - 40 IU/L   ALT 21 0 - 32 IU/L  HgB A1c  Result Value Ref Range   Hgb A1c MFr Bld 6.7 (H) 4.8 - 5.6 %   Est. average glucose Bld gHb Est-mCnc 146 mg/dL  Lipid Profile  Result Value Ref Range   Cholesterol, Total 197 100 - 199 mg/dL   Triglycerides 301 0 - 149 mg/dL   HDL 49 >60 mg/dL   VLDL Cholesterol Cal 23 5 - 40 mg/dL   LDL Chol Calc (NIH) 109  (H) 0 - 99 mg/dL   Chol/HDL Ratio 4.0 0.0 - 4.4 ratio  Vitamin D (25 hydroxy)  Result Value Ref Range   Vit D, 25-Hydroxy 36.0 30.0 - 100.0 ng/mL      Assessment & Plan:   Problem List Items Addressed This Visit       Endocrine   Controlled diabetes mellitus type 2 with complications (HCC) - Primary    New problem.  A1c is well controlled at 6.7%.  Will continue without medication at this time.  Lengthy discussion had regarding diet changes.  Sugars are running <120 at home.  Already on ARB for kidney protection.  Will start Statin for Cardio protection.  Foot exam done today.  Will check microalbumin at next visit.  Discussed need for eye exam.  Follow up in 3 months.  Call sooner if concerns arise.       Relevant Medications   rosuvastatin (CRESTOR) 10 MG tablet     Follow up plan: Return in about 3 months (around 02/10/2024) for HTN, HLD, DM2 FU.

## 2023-11-10 ENCOUNTER — Encounter: Payer: Self-pay | Admitting: Nurse Practitioner

## 2023-11-10 ENCOUNTER — Ambulatory Visit (INDEPENDENT_AMBULATORY_CARE_PROVIDER_SITE_OTHER): Payer: BC Managed Care – PPO | Admitting: Nurse Practitioner

## 2023-11-10 VITALS — BP 138/40 | HR 62 | Temp 97.7°F | Ht 62.0 in | Wt 178.6 lb

## 2023-11-10 DIAGNOSIS — E118 Type 2 diabetes mellitus with unspecified complications: Secondary | ICD-10-CM | POA: Diagnosis not present

## 2023-11-10 MED ORDER — ROSUVASTATIN CALCIUM 10 MG PO TABS: 10.0000 mg | ORAL_TABLET | Freq: Every day | ORAL | 1 refills | Status: AC

## 2023-11-10 NOTE — Assessment & Plan Note (Signed)
New problem.  A1c is well controlled at 6.7%.  Will continue without medication at this time.  Lengthy discussion had regarding diet changes.  Sugars are running <120 at home.  Already on ARB for kidney protection.  Will start Statin for Cardio protection.  Foot exam done today.  Will check microalbumin at next visit.  Discussed need for eye exam.  Follow up in 3 months.  Call sooner if concerns arise.

## 2023-11-14 ENCOUNTER — Ambulatory Visit: Payer: Self-pay

## 2023-11-14 NOTE — Telephone Encounter (Signed)
  Chief Complaint: chest pain Symptoms: chest pain on left side, comes and goes, dizziness at times  Frequency: 2 days  Pertinent Negatives: NA Disposition: [] ED /[] Urgent Care (no appt availability in office) / [x] Appointment(In office/virtual)/ []  [facility]/ [] Home Care/ [] Refused Recommended Disposition /[] [facility]/ []  Follow-up with PCP Additional Notes: spoke with pt's husband Elita Quick, pt was present, states chest pain was really bad last night 10/10 but now not so bad only 3/10. Scheduled OV tomorrow at 0820 with PCP, advised if sx get worse to go  UC or ED. Jose verbalized understanding.  Reason for Disposition  [1] Chest pain lasts > 5 minutes AND [2] occurred > 3 days ago (72 hours) AND [3] NO chest pain or cardiac symptoms now  Answer Assessment - Initial Assessment Questions 1. LOCATION: "Where does it hurt?"       Left side  3. ONSET: "When did the chest pain begin?" (Minutes, hours or days)      Last night  4. PATTERN: "Does the pain come and go, or has it been constant since it started?"  "Does it get worse with exertion?"      Comes and goes  6. SEVERITY: "How bad is the pain?"  (e.g., Scale 1-10; mild, moderate, or severe)    - MILD (1-3): doesn't interfere with normal activities     - MODERATE (4-7): interferes with normal activities or awakens from sleep    - SEVERE (8-10): excruciating pain, unable to do any normal activities       10 last night, now 3 7. CARDIAC RISK FACTORS: "Do you have any history of heart problems or risk factors for heart disease?" (e.g., angina, prior heart attack; diabetes, high blood pressure, high cholesterol, smoker, or strong family history of heart disease)     yes 10. OTHER SYMPTOMS: "Do you have any other symptoms?" (e.g., dizziness, nausea, vomiting, sweating, fever, difficulty breathing, cough)       Dizziness comes and goes  Protocols used: Chest Pain-A-AH

## 2023-11-14 NOTE — Telephone Encounter (Signed)
FYI for appointment tomorrow.

## 2023-11-15 ENCOUNTER — Encounter: Payer: Self-pay | Admitting: Nurse Practitioner

## 2023-11-15 ENCOUNTER — Ambulatory Visit (INDEPENDENT_AMBULATORY_CARE_PROVIDER_SITE_OTHER): Payer: BC Managed Care – PPO | Admitting: Nurse Practitioner

## 2023-11-15 VITALS — BP 133/87 | HR 66 | Temp 97.8°F | Ht 62.0 in | Wt 180.4 lb

## 2023-11-15 DIAGNOSIS — R079 Chest pain, unspecified: Secondary | ICD-10-CM | POA: Diagnosis not present

## 2023-11-15 NOTE — Progress Notes (Signed)
BP 133/87 (BP Location: Left Arm, Patient Position: Sitting, Cuff Size: Normal)   Pulse 66   Temp 97.8 F (36.6 C) (Oral)   Ht 5\' 2"  (1.575 m)   Wt 180 lb 6.4 oz (81.8 kg)   LMP  (LMP Unknown)   SpO2 97%   BMI 33.00 kg/m    Subjective:    Patient ID: Tara Bishop, female    DOB: 1971-08-16, 52 y.o.   MRN: [id number]  HPI: Tara Bishop is a 52 y.o. female  Chief Complaint  Patient presents with   Chest Pain    Sharp pain coming from the heart, started last night and the night before, The night before was more severe than last night. no pain this morning, has happened before    CHEST PAIN Time since onset: sharp pain coming from heart that started last night and the night before.  Lasted for at least an hour.  Had the pain all day long the day before.   Duration:days Onset: sudden Quality: sharp Severity: 10/10 Location:  center of right breast Radiation: none Episode duration: varies Frequency: intermittent Related to exertion: no Activity when pain started:  Trauma: no Anxiety/recent stressors: yes- about the pain Aggravating factors:  laying on her right side Alleviating factors: not sure Status: better Treatments attempted: nothing  Current pain status: pain free Shortness of breath: yes Cough: no Nausea: yes- on Monday Diaphoresis: yes Heartburn: no Palpitations: yes  Relevant past medical, surgical, family and social history reviewed and updated as indicated. Interim medical history since our last visit reviewed. Allergies and medications reviewed and updated.  Review of Systems  Respiratory:  Positive for shortness of breath. Negative for cough.   Cardiovascular:  Positive for chest pain and palpitations.  Gastrointestinal:  Positive for nausea. Negative for abdominal pain, diarrhea and vomiting.    Per HPI unless specifically indicated above     Objective:    BP 133/87 (BP Location: Left Arm, Patient Position: Sitting, Cuff Size:  Normal)   Pulse 66   Temp 97.8 F (36.6 C) (Oral)   Ht 5\' 2"  (1.575 m)   Wt 180 lb 6.4 oz (81.8 kg)   LMP  (LMP Unknown)   SpO2 97%   BMI 33.00 kg/m   Wt Readings from Last 3 Encounters:  11/15/23 180 lb 6.4 oz (81.8 kg)  11/10/23 178 lb 9.6 oz (81 kg)  11/07/23 179 lb 6.4 oz (81.4 kg)    Physical Exam Vitals and nursing note reviewed.  Constitutional:      General: She is not in acute distress.    Appearance: Normal appearance. She is normal weight. She is not ill-appearing, toxic-appearing or diaphoretic.  HENT:     Head: Normocephalic.     Right Ear: External ear normal.     Left Ear: External ear normal.     Nose: Nose normal.     Mouth/Throat:     Mouth: Mucous membranes are moist.     Pharynx: Oropharynx is clear.  Eyes:     General:        Right eye: No discharge.        Left eye: No discharge.     Extraocular Movements: Extraocular movements intact.     Conjunctiva/sclera: Conjunctivae normal.     Pupils: Pupils are equal, round, and reactive to light.  Cardiovascular:     Rate and Rhythm: Normal rate and regular rhythm.     Heart sounds: No murmur heard. Pulmonary:  Effort: Pulmonary effort is normal. No respiratory distress.     Breath sounds: Normal breath sounds. No wheezing or rales.  Abdominal:     General: Abdomen is flat. Bowel sounds are normal. There is no distension.     Palpations: Abdomen is soft.     Tenderness: There is no abdominal tenderness. There is no right CVA tenderness, left CVA tenderness or guarding.  Musculoskeletal:     Cervical back: Normal range of motion and neck supple.  Skin:    General: Skin is warm and dry.     Capillary Refill: Capillary refill takes less than 2 seconds.  Neurological:     General: No focal deficit present.     Mental Status: She is alert and oriented to person, place, and time. Mental status is at baseline.  Psychiatric:        Mood and Affect: Mood normal.        Behavior: Behavior normal.         Thought Content: Thought content normal.        Judgment: Judgment normal.     Results for orders placed or performed in visit on 11/07/23  Comp Met (CMET)  Result Value Ref Range   Glucose 111 (H) 70 - 99 mg/dL   BUN 14 6 - 24 mg/dL   Creatinine, Ser 5.40 0.57 - 1.00 mg/dL   eGFR 99 >98 JX/BJY/7.82   BUN/Creatinine Ratio 19 9 - 23   Sodium 137 134 - 144 mmol/L   Potassium 4.6 3.5 - 5.2 mmol/L   Chloride 102 96 - 106 mmol/L   CO2 23 20 - 29 mmol/L   Calcium 9.2 8.7 - 10.2 mg/dL   Total Protein 6.5 6.0 - 8.5 g/dL   Albumin 4.2 3.9 - 4.9 g/dL   Globulin, Total 2.3 1.5 - 4.5 g/dL   Bilirubin Total 0.4 0.0 - 1.2 mg/dL   Alkaline Phosphatase 79 44 - 121 IU/L   AST 26 0 - 40 IU/L   ALT 21 0 - 32 IU/L  HgB A1c  Result Value Ref Range   Hgb A1c MFr Bld 6.7 (H) 4.8 - 5.6 %   Est. average glucose Bld gHb Est-mCnc 146 mg/dL  Lipid Profile  Result Value Ref Range   Cholesterol, Total 197 100 - 199 mg/dL   Triglycerides 956 0 - 149 mg/dL   HDL 49 >21 mg/dL   VLDL Cholesterol Cal 23 5 - 40 mg/dL   LDL Chol Calc (NIH) 308 (H) 0 - 99 mg/dL   Chol/HDL Ratio 4.0 0.0 - 4.4 ratio  Vitamin D (25 hydroxy)  Result Value Ref Range   Vit D, 25-Hydroxy 36.0 30.0 - 100.0 ng/mL      Assessment & Plan:   Problem List Items Addressed This Visit   None Visit Diagnoses     Chest pain, unspecified type    -  Primary Suspect it is related to GERD or musculoskeletal. Recommend taking TUMS and if no relief in 30 mins take NSAID. Keep log of symptoms and bring to next visit. Follow up in 1 week. EKG showed normal sinus rhythym.    Relevant Orders   EKG 12-Lead (Completed)        Follow up plan: Return in about 1 week (around 11/22/2023) for Chest pain.  A total of 30 minutes were spent on this encounter today.  When total time is documented, this includes both the face-to-face and non-face-to-face time personally spent before, during and after the visit on  the date of the encounter reviewing  symptoms, plan of care, exam.

## 2023-11-20 NOTE — Progress Notes (Unsigned)
LMP  (LMP Unknown)    Subjective:    Patient ID: Tara Bishop, female    DOB: Dec 21, 1970, 52 y.o.   MRN: [id number]  HPI: Tara Bishop is a 52 y.o. female  No chief complaint on file.  CHEST PAIN Time since onset: sharp pain coming from heart that started last night and the night before.  Lasted for at least an hour.  Had the pain all day long the day before.   Duration:days Onset: sudden Quality: sharp Severity: 10/10 Location:  center of right breast Radiation: none Episode duration: varies Frequency: intermittent Related to exertion: no Activity when pain started:  Trauma: no Anxiety/recent stressors: yes- about the pain Aggravating factors:  laying on her right side Alleviating factors: not sure Status: better Treatments attempted: nothing  Current pain status: pain free Shortness of breath: yes Cough: no Nausea: yes- on Monday Diaphoresis: yes Heartburn: no Palpitations: yes  Relevant past medical, surgical, family and social history reviewed and updated as indicated. Interim medical history since our last visit reviewed. Allergies and medications reviewed and updated.  Review of Systems  Respiratory:  Positive for shortness of breath. Negative for cough.   Cardiovascular:  Positive for chest pain and palpitations.  Gastrointestinal:  Positive for nausea. Negative for abdominal pain, diarrhea and vomiting.    Per HPI unless specifically indicated above     Objective:    LMP  (LMP Unknown)   Wt Readings from Last 3 Encounters:  11/15/23 180 lb 6.4 oz (81.8 kg)  11/10/23 178 lb 9.6 oz (81 kg)  11/07/23 179 lb 6.4 oz (81.4 kg)    Physical Exam Vitals and nursing note reviewed.  Constitutional:      General: She is not in acute distress.    Appearance: Normal appearance. She is normal weight. She is not ill-appearing, toxic-appearing or diaphoretic.  HENT:     Head: Normocephalic.     Right Ear: External ear normal.     Left Ear: External  ear normal.     Nose: Nose normal.     Mouth/Throat:     Mouth: Mucous membranes are moist.     Pharynx: Oropharynx is clear.  Eyes:     General:        Right eye: No discharge.        Left eye: No discharge.     Extraocular Movements: Extraocular movements intact.     Conjunctiva/sclera: Conjunctivae normal.     Pupils: Pupils are equal, round, and reactive to light.  Cardiovascular:     Rate and Rhythm: Normal rate and regular rhythm.     Heart sounds: No murmur heard. Pulmonary:     Effort: Pulmonary effort is normal. No respiratory distress.     Breath sounds: Normal breath sounds. No wheezing or rales.  Abdominal:     General: Abdomen is flat. Bowel sounds are normal. There is no distension.     Palpations: Abdomen is soft.     Tenderness: There is no abdominal tenderness. There is no right CVA tenderness, left CVA tenderness or guarding.  Musculoskeletal:     Cervical back: Normal range of motion and neck supple.  Skin:    General: Skin is warm and dry.     Capillary Refill: Capillary refill takes less than 2 seconds.  Neurological:     General: No focal deficit present.     Mental Status: She is alert and oriented to person, place, and time. Mental status is at baseline.  Psychiatric:        Mood and Affect: Mood normal.        Behavior: Behavior normal.        Thought Content: Thought content normal.        Judgment: Judgment normal.    Results for orders placed or performed in visit on 11/07/23  Comp Met (CMET)  Result Value Ref Range   Glucose 111 (H) 70 - 99 mg/dL   BUN 14 6 - 24 mg/dL   Creatinine, Ser 9.14 0.57 - 1.00 mg/dL   eGFR 99 >78 GN/FAO/1.30   BUN/Creatinine Ratio 19 9 - 23   Sodium 137 134 - 144 mmol/L   Potassium 4.6 3.5 - 5.2 mmol/L   Chloride 102 96 - 106 mmol/L   CO2 23 20 - 29 mmol/L   Calcium 9.2 8.7 - 10.2 mg/dL   Total Protein 6.5 6.0 - 8.5 g/dL   Albumin 4.2 3.9 - 4.9 g/dL   Globulin, Total 2.3 1.5 - 4.5 g/dL   Bilirubin Total 0.4  0.0 - 1.2 mg/dL   Alkaline Phosphatase 79 44 - 121 IU/L   AST 26 0 - 40 IU/L   ALT 21 0 - 32 IU/L  HgB A1c  Result Value Ref Range   Hgb A1c MFr Bld 6.7 (H) 4.8 - 5.6 %   Est. average glucose Bld gHb Est-mCnc 146 mg/dL  Lipid Profile  Result Value Ref Range   Cholesterol, Total 197 100 - 199 mg/dL   Triglycerides 865 0 - 149 mg/dL   HDL 49 >78 mg/dL   VLDL Cholesterol Cal 23 5 - 40 mg/dL   LDL Chol Calc (NIH) 469 (H) 0 - 99 mg/dL   Chol/HDL Ratio 4.0 0.0 - 4.4 ratio  Vitamin D (25 hydroxy)  Result Value Ref Range   Vit D, 25-Hydroxy 36.0 30.0 - 100.0 ng/mL      Assessment & Plan:   Problem List Items Addressed This Visit   None Visit Diagnoses     Chest pain, unspecified type    -  Primary Suspect it is related to GERD or musculoskeletal. Recommend taking TUMS and if no relief in 30 mins take NSAID. Keep log of symptoms and bring to next visit. Follow up in 1 week. EKG showed normal sinus rhythym.    Relevant Orders   EKG 12-Lead (Completed)        Follow up plan: No follow-ups on file.  A total of 30 minutes were spent on this encounter today.  When total time is documented, this includes both the face-to-face and non-face-to-face time personally spent before, during and after the visit on the date of the encounter reviewing symptoms, plan of care, exam.

## 2023-11-21 ENCOUNTER — Ambulatory Visit: Payer: BC Managed Care – PPO | Admitting: Nurse Practitioner

## 2024-02-12 ENCOUNTER — Ambulatory Visit: Payer: BC Managed Care – PPO | Admitting: Nurse Practitioner

## 2024-02-12 NOTE — Progress Notes (Deleted)
 LMP  (LMP Unknown)    Subjective:    Patient ID: Tara Bishop, female    DOB: 02-Sep-1971, 53 y.o.   MRN: [id number]  HPI: Tara Bishop is a 53 y.o. female  No chief complaint on file.  HYPERTENSION / HYPERLIPIDEMIA Patient was recently seen in the ER for elevated diastolic blood pressures.  She was started on HCTZ.   Satisfied with current treatment? yes Duration of hypertension: years BP monitoring frequency: daily BP range: 117-155/80 BP medication side effects: no Past BP meds: HCTZ Duration of hyperlipidemia: years Cholesterol medication side effects: no Cholesterol supplements: none Past cholesterol medications: none Medication compliance: excellent compliance Aspirin: no Recent stressors: no Recurrent headaches: yes- worse with elevated blood pressure Visual changes: no Palpitations: no Dyspnea: no Chest pain: yes Lower extremity edema: no Dizzy/lightheaded: no  DIABETES Hypoglycemic episodes:{Blank single:19197::"yes","no"} Polydipsia/polyuria: {Blank single:19197::"yes","no"} Visual disturbance: {Blank single:19197::"yes","no"} Chest pain: {Blank single:19197::"yes","no"} Paresthesias: {Blank single:19197::"yes","no"} Glucose Monitoring: {Blank single:19197::"yes","no"}  Accucheck frequency: {Blank single:19197::"Not Checking","Daily","BID","TID"}  Fasting glucose:  Post prandial:  Evening:  Before meals: Taking Insulin?: {Blank single:19197::"yes","no"}  Long acting insulin:  Short acting insulin: Blood Pressure Monitoring: {Blank single:19197::"not checking","rarely","daily","weekly","monthly","a few times a day","a few times a week","a few times a month"} Retinal Examination: {Blank single:19197::"Up to Date","Not up to Date"} Foot Exam: {Blank single:19197::"Up to Date","Not up to Date"} Diabetic Education: {Blank single:19197::"Completed","Not Completed"} Pneumovax: {Blank single:19197::"Up to Date","Not up to Date","unknown"} Influenza:  {Blank single:19197::"Up to Date","Not up to Date","unknown"} Aspirin: {Blank single:19197::"yes","no"}  ELEVATED DEPRESSION SCREENING Patient states she does feel a little down and depressed.  She doesn't want to start medication today.  Would like to think about it over the next couple of weeks and re discuss at our follow up.  She would consider therapy at some point but would like to work on it on her own at this time.  Denies thoughts of self harm but does feel like she is a burden to others.      11/10/2023   11:37 AM 11/07/2023   10:34 AM 08/03/2023    9:37 AM  PHQ9 SCORE ONLY  PHQ-9 Total Score 0 0 0        11/10/2023   11:36 AM 11/07/2023   10:34 AM 08/03/2023    9:37 AM 05/12/2023    9:30 AM  GAD 7 : Generalized Anxiety Score  Nervous, Anxious, on Edge 0 0 0 0  Control/stop worrying 0 0 0 0  Worry too much - different things 0 0 0 0  Trouble relaxing 0 0 1 0  Restless 0 0 0 0  Easily annoyed or irritable 0 0 0 0  Afraid - awful might happen 0 0 0 0  Total GAD 7 Score 0 0 1 0  Anxiety Difficulty   Not difficult at all Not difficult at all     Relevant past medical, surgical, family and social history reviewed and updated as indicated. Interim medical history since our last visit reviewed. Allergies and medications reviewed and updated.  Review of Systems  Eyes:  Negative for visual disturbance.  Respiratory:  Negative for cough, chest tightness and shortness of breath.   Cardiovascular:  Negative for chest pain, palpitations and leg swelling.  Neurological:  Negative for dizziness and headaches.  Psychiatric/Behavioral:  Negative for dysphoric mood and suicidal ideas. The patient is not nervous/anxious.     Per HPI unless specifically indicated above     Objective:    LMP  (LMP Unknown)   Wt Readings from  Last 3 Encounters:  11/15/23 180 lb 6.4 oz (81.8 kg)  11/10/23 178 lb 9.6 oz (81 kg)  11/07/23 179 lb 6.4 oz (81.4 kg)    Physical Exam Vitals and  nursing note reviewed.  Constitutional:      General: She is not in acute distress.    Appearance: Normal appearance. She is normal weight. She is not ill-appearing, toxic-appearing or diaphoretic.  HENT:     Head: Normocephalic.     Right Ear: External ear normal.     Left Ear: External ear normal.     Nose: Nose normal.     Mouth/Throat:     Mouth: Mucous membranes are moist.     Pharynx: Oropharynx is clear.  Eyes:     General:        Right eye: No discharge.        Left eye: No discharge.     Extraocular Movements: Extraocular movements intact.     Conjunctiva/sclera: Conjunctivae normal.     Pupils: Pupils are equal, round, and reactive to light.  Cardiovascular:     Rate and Rhythm: Normal rate and regular rhythm.     Heart sounds: No murmur heard. Pulmonary:     Effort: Pulmonary effort is normal. No respiratory distress.     Breath sounds: Normal breath sounds. No wheezing or rales.  Musculoskeletal:     Cervical back: Normal range of motion and neck supple.  Skin:    General: Skin is warm and dry.     Capillary Refill: Capillary refill takes less than 2 seconds.  Neurological:     General: No focal deficit present.     Mental Status: She is alert and oriented to person, place, and time. Mental status is at baseline.  Psychiatric:        Mood and Affect: Mood normal.        Behavior: Behavior normal.        Thought Content: Thought content normal.        Judgment: Judgment normal.     Results for orders placed or performed in visit on 11/07/23  Comp Met (CMET)   Collection Time: 11/07/23 11:14 AM  Result Value Ref Range   Glucose 111 (H) 70 - 99 mg/dL   BUN 14 6 - 24 mg/dL   Creatinine, Ser 1.61 0.57 - 1.00 mg/dL   eGFR 99 >09 UE/AVW/0.98   BUN/Creatinine Ratio 19 9 - 23   Sodium 137 134 - 144 mmol/L   Potassium 4.6 3.5 - 5.2 mmol/L   Chloride 102 96 - 106 mmol/L   CO2 23 20 - 29 mmol/L   Calcium 9.2 8.7 - 10.2 mg/dL   Total Protein 6.5 6.0 - 8.5 g/dL    Albumin 4.2 3.9 - 4.9 g/dL   Globulin, Total 2.3 1.5 - 4.5 g/dL   Bilirubin Total 0.4 0.0 - 1.2 mg/dL   Alkaline Phosphatase 79 44 - 121 IU/L   AST 26 0 - 40 IU/L   ALT 21 0 - 32 IU/L  HgB A1c   Collection Time: 11/07/23 11:14 AM  Result Value Ref Range   Hgb A1c MFr Bld 6.7 (H) 4.8 - 5.6 %   Est. average glucose Bld gHb Est-mCnc 146 mg/dL  Lipid Profile   Collection Time: 11/07/23 11:14 AM  Result Value Ref Range   Cholesterol, Total 197 100 - 199 mg/dL   Triglycerides 119 0 - 149 mg/dL   HDL 49 >14 mg/dL   VLDL Cholesterol Cal 23  5 - 40 mg/dL   LDL Chol Calc (NIH) 161 (H) 0 - 99 mg/dL   Chol/HDL Ratio 4.0 0.0 - 4.4 ratio  Vitamin D (25 hydroxy)   Collection Time: 11/07/23 11:14 AM  Result Value Ref Range   Vit D, 25-Hydroxy 36.0 30.0 - 100.0 ng/mL      Assessment & Plan:   Problem List Items Addressed This Visit       Cardiovascular and Mediastinum   Hypertension     Endocrine   Controlled diabetes mellitus type 2 with complications (HCC) - Primary     Other   Vitamin D deficiency   Hypercholesterolemia     Follow up plan: No follow-ups on file.

## 2024-05-06 ENCOUNTER — Encounter: Payer: Self-pay | Admitting: Nurse Practitioner

## 2024-05-06 NOTE — Progress Notes (Deleted)
 LMP  (LMP Unknown)    Subjective:    Patient ID: Tara Bishop, female    DOB: 1971-07-17, 53 y.o.   MRN: [id number]  HPI: Tara Bishop is a 53 y.o. female presenting on 05/06/2024 for comprehensive medical examination. Current medical complaints include:chest pain and not being able to eat  She currently lives with: Menopausal Symptoms: no  HYPERTENSION / HYPERLIPIDEMIA Patient was recently seen in the ER for elevated diastolic blood pressures.  She was started on HCTZ.   Satisfied with current treatment? yes Duration of hypertension: years BP monitoring frequency: hasn't been checking since she was seen in the ER.  BP range:  BP medication side effects: no Past BP meds: HCTZ Duration of hyperlipidemia: years Cholesterol medication side effects: no Cholesterol supplements: none Past cholesterol medications: none Medication compliance: excellent compliance Aspirin: no Recent stressors: no Recurrent headaches: yes Visual changes: no Palpitations: no Dyspnea: no Chest pain: yes Lower extremity edema: no Dizzy/lightheaded: no  GERD Was started on pantoprazole  in the ER and chest pain has improved.   GERD control status: better Satisfied with current treatment? yes Heartburn frequency:  Medication side effects: no  Medication compliance: better  INSOMNIA Duration: months Satisfied with sleep quality: no Difficulty falling asleep: yes Difficulty staying asleep: no Waking a few hours after sleep onset: no Early morning awakenings: no Daytime hypersomnolence: no Wakes feeling refreshed: no Good sleep hygiene: yes Apnea: no Snoring: yes Depressed/anxious mood: yes Recent stress: yes Restless legs/nocturnal leg cramps: no Chronic pain/arthritis: no History of sleep study: no Treatments attempted: none    ELEVATED DEPRESSION SCREENING Patient states she does feel a little down and depressed.  She doesn't want to start medication today.  Would like to  think about it over the next couple of weeks and re discuss at our follow up.  She would consider therapy at some point but would like to work on it on her own at this time.  Denies thoughts of self harm but does feel like she is a burden to others.  Depression Screen done today and results listed below:     11/10/2023   11:37 AM 11/07/2023   10:34 AM 08/03/2023    9:37 AM 05/12/2023    9:29 AM 04/13/2023    8:36 AM  Depression screen PHQ 2/9  Decreased Interest 0 0 0 0 0  Down, Depressed, Hopeless 0 0 0 0 3  PHQ - 2 Score 0 0 0 0 3  Altered sleeping 0 0 0 1 3  Tired, decreased energy 0 0 0 1 2  Change in appetite 0 0 0 0 3  Feeling bad or failure about yourself  0 0 0 0 2  Trouble concentrating 0 0 0 0 0  Moving slowly or fidgety/restless 0 0 0 0 0  Suicidal thoughts 0 0 0 0 2  PHQ-9 Score 0 0 0 2 15  Difficult doing work/chores   Not difficult at all Not difficult at all Somewhat difficult    The patient does not have a history of falls. I did complete a risk assessment for falls. A plan of care for falls was documented.   Past Medical History:  Past Medical History:  Diagnosis Date  . Acute pain of left knee 06/08/2020    Surgical History:  Past Surgical History:  Procedure Laterality Date  . COLONOSCOPY WITH PROPOFOL  N/A 05/17/2023   Procedure: COLONOSCOPY WITH PROPOFOL ;  Surgeon: Selena Daily, MD;  Location: [facility] ENDOSCOPY;  Service:  Gastroenterology;  Laterality: N/A;  MAY NEED SPANISH INTERPRETER IN PRE AND POST  . TOTAL ABDOMINAL HYSTERECTOMY  09/26/2014   partial    Medications:  Current Outpatient Medications on File Prior to Visit  Medication Sig  . rosuvastatin  (CRESTOR ) 10 MG tablet Take 1 tablet (10 mg total) by mouth daily.  . valsartan  (DIOVAN ) 80 MG tablet Take 1 tablet (80 mg total) by mouth daily.   No current facility-administered medications on file prior to visit.    Allergies:  No Known Allergies  Social History:  Social History    Socioeconomic History  . Marital status: Married    Spouse name: Not on file  . Number of children: Not on file  . Years of education: Not on file  . Highest education level: Not on file  Occupational History  . Not on file  Tobacco Use  . Smoking status: Never  . Smokeless tobacco: Never  Vaping Use  . Vaping status: Never Used  Substance and Sexual Activity  . Alcohol use: No    Alcohol/week: 0.0 standard drinks of alcohol  . Drug use: No  . Sexual activity: Yes  Other Topics Concern  . Not on file  Social History Narrative  . Not on file   Social Drivers of Health   Financial Resource Strain: Not on file  Food Insecurity: Not on file  Transportation Needs: Not on file  Physical Activity: Not on file  Stress: Not on file  Social Connections: Not on file  Intimate Partner Violence: Not on file   Social History   Tobacco Use  Smoking Status Never  Smokeless Tobacco Never   Social History   Substance and Sexual Activity  Alcohol Use No  . Alcohol/week: 0.0 standard drinks of alcohol    Family History:  Family History  Problem Relation Age of Onset  . Cancer Mother   . Breast cancer Neg Hx     Past medical history, surgical history, medications, allergies, family history and social history reviewed with patient today and changes made to appropriate areas of the chart.   Review of Systems  Eyes:  Negative for blurred vision and double vision.  Respiratory:  Negative for shortness of breath.   Cardiovascular:  Positive for chest pain. Negative for palpitations and leg swelling.  Neurological:  Positive for headaches. Negative for dizziness.  Psychiatric/Behavioral:  Positive for depression. Negative for suicidal ideas. The patient is nervous/anxious.    All other ROS negative except what is listed above and in the HPI.      Objective:    LMP  (LMP Unknown)   Wt Readings from Last 3 Encounters:  11/15/23 180 lb 6.4 oz (81.8 kg)  11/10/23 178 lb 9.6  oz (81 kg)  11/07/23 179 lb 6.4 oz (81.4 kg)    Physical Exam Vitals and nursing note reviewed.  Constitutional:      General: She is awake. She is not in acute distress.    Appearance: Normal appearance. She is well-developed. She is not ill-appearing.  HENT:     Head: Normocephalic and atraumatic.     Right Ear: Hearing, tympanic membrane, ear canal and external ear normal. No drainage.     Left Ear: Hearing, tympanic membrane, ear canal and external ear normal. No drainage.     Nose: Nose normal.     Right Sinus: No maxillary sinus tenderness or frontal sinus tenderness.     Left Sinus: No maxillary sinus tenderness or frontal sinus tenderness.  Mouth/Throat:     Mouth: Mucous membranes are moist.     Pharynx: Oropharynx is clear. Uvula midline. No pharyngeal swelling, oropharyngeal exudate or posterior oropharyngeal erythema.  Eyes:     General: Lids are normal.        Right eye: No discharge.        Left eye: No discharge.     Extraocular Movements: Extraocular movements intact.     Conjunctiva/sclera: Conjunctivae normal.     Pupils: Pupils are equal, round, and reactive to light.     Visual Fields: Right eye visual fields normal and left eye visual fields normal.  Neck:     Thyroid: No thyromegaly.     Vascular: No carotid bruit.     Trachea: Trachea normal.  Cardiovascular:     Rate and Rhythm: Normal rate and regular rhythm.     Heart sounds: Normal heart sounds. No murmur heard.    No gallop.  Pulmonary:     Effort: Pulmonary effort is normal. No accessory muscle usage or respiratory distress.     Breath sounds: Normal breath sounds.  Chest:  Breasts:    Right: Normal.     Left: Normal.  Abdominal:     General: Bowel sounds are normal.     Palpations: Abdomen is soft. There is no hepatomegaly or splenomegaly.     Tenderness: There is no abdominal tenderness.  Musculoskeletal:        General: Normal range of motion.     Cervical back: Normal range of motion  and neck supple.     Right lower leg: No edema.     Left lower leg: No edema.  Lymphadenopathy:     Head:     Right side of head: No submental, submandibular, tonsillar, preauricular or posterior auricular adenopathy.     Left side of head: No submental, submandibular, tonsillar, preauricular or posterior auricular adenopathy.     Cervical: No cervical adenopathy.     Upper Body:     Right upper body: No supraclavicular, axillary or pectoral adenopathy.     Left upper body: No supraclavicular, axillary or pectoral adenopathy.  Skin:    General: Skin is warm and dry.     Capillary Refill: Capillary refill takes less than 2 seconds.     Findings: No rash.  Neurological:     Mental Status: She is alert and oriented to person, place, and time.     Gait: Gait is intact.  Psychiatric:        Attention and Perception: Attention normal.        Mood and Affect: Mood normal.        Speech: Speech normal.        Behavior: Behavior normal. Behavior is cooperative.        Thought Content: Thought content normal.        Judgment: Judgment normal.    Results for orders placed or performed in visit on 11/07/23  Comp Met (CMET)   Collection Time: 11/07/23 11:14 AM  Result Value Ref Range   Glucose 111 (H) 70 - 99 mg/dL   BUN 14 6 - 24 mg/dL   Creatinine, Ser 1.61 0.57 - 1.00 mg/dL   eGFR 99 >09 UE/AVW/0.98   BUN/Creatinine Ratio 19 9 - 23   Sodium 137 134 - 144 mmol/L   Potassium 4.6 3.5 - 5.2 mmol/L   Chloride 102 96 - 106 mmol/L   CO2 23 20 - 29 mmol/L   Calcium  9.2 8.7 -  10.2 mg/dL   Total Protein 6.5 6.0 - 8.5 g/dL   Albumin 4.2 3.9 - 4.9 g/dL   Globulin, Total 2.3 1.5 - 4.5 g/dL   Bilirubin Total 0.4 0.0 - 1.2 mg/dL   Alkaline Phosphatase 79 44 - 121 IU/L   AST 26 0 - 40 IU/L   ALT 21 0 - 32 IU/L  HgB A1c   Collection Time: 11/07/23 11:14 AM  Result Value Ref Range   Hgb A1c MFr Bld 6.7 (H) 4.8 - 5.6 %   Est. average glucose Bld gHb Est-mCnc 146 mg/dL  Lipid Profile    Collection Time: 11/07/23 11:14 AM  Result Value Ref Range   Cholesterol, Total 197 100 - 199 mg/dL   Triglycerides 161 0 - 149 mg/dL   HDL 49 >09 mg/dL   VLDL Cholesterol Cal 23 5 - 40 mg/dL   LDL Chol Calc (NIH) 604 (H) 0 - 99 mg/dL   Chol/HDL Ratio 4.0 0.0 - 4.4 ratio  Vitamin D  (25 hydroxy)   Collection Time: 11/07/23 11:14 AM  Result Value Ref Range   Vit D, 25-Hydroxy 36.0 30.0 - 100.0 ng/mL      Assessment & Plan:   Problem List Items Addressed This Visit   None     Follow up plan: No follow-ups on file.   LABORATORY TESTING:  - Pap smear: not applicable  IMMUNIZATIONS:   - Tdap: Tetanus vaccination status reviewed: last tetanus booster within 10 years. - Influenza: Postponed to flu season - Pneumovax: Not applicable - Prevnar: Not applicable - COVID: Not applicable - HPV: Not applicable - Shingrix  vaccine: Administered today  SCREENING: -Mammogram: Ordered today  - Colonoscopy: Ordered today  - Bone Density: Not applicable  -Hearing Test: Not applicable  -Spirometry: Not applicable   PATIENT COUNSELING:   Advised to take 1 mg of folate supplement per day if capable of pregnancy.   Sexuality: Discussed sexually transmitted diseases, partner selection, use of condoms, avoidance of unintended pregnancy  and contraceptive alternatives.   Advised to avoid cigarette smoking.  I discussed with the patient that most people either abstain from alcohol or drink within safe limits (<=14/week and <=4 drinks/occasion for males, <=7/weeks and <= 3 drinks/occasion for females) and that the risk for alcohol disorders and other health effects rises proportionally with the number of drinks per week and how often a drinker exceeds daily limits.  Discussed cessation/primary prevention of drug use and availability of treatment for abuse.   Diet: Encouraged to adjust caloric intake to maintain  or achieve ideal body weight, to reduce intake of dietary saturated fat and total  fat, to limit sodium intake by avoiding high sodium foods and not adding table salt, and to maintain adequate dietary potassium and calcium  preferably from fresh fruits, vegetables, and low-fat dairy products.    stressed the importance of regular exercise  Injury prevention: Discussed safety belts, safety helmets, smoke detector, smoking near bedding or upholstery.   Dental health: Discussed importance of regular tooth brushing, flossing, and dental visits.    NEXT PREVENTATIVE PHYSICAL DUE IN 1 YEAR. No follow-ups on file.

## 2024-08-25 ENCOUNTER — Other Ambulatory Visit: Payer: Self-pay | Admitting: Nurse Practitioner

## 2024-08-26 NOTE — Telephone Encounter (Signed)
 Unable to refill per protocol, appointment needed.   Requested Prescriptions  Pending Prescriptions Disp Refills   rosuvastatin  (CRESTOR ) 10 MG tablet [Pharmacy Med Name: ROSUVASTATIN  CALCIUM  10 MG TAB] 90 tablet 1    Sig: TAKE 1 TABLET BY MOUTH EVERY DAY     Cardiovascular:  Antilipid - Statins 2 Failed - 08/26/2024  4:24 PM      Failed - Valid encounter within last 12 months    Recent Outpatient Visits   None            Failed - Lipid Panel in normal range within the last 12 months    Cholesterol, Total  Date Value Ref Range Status  11/07/2023 197 100 - 199 mg/dL Final   LDL Chol Calc (NIH)  Date Value Ref Range Status  11/07/2023 125 (H) 0 - 99 mg/dL Final   HDL  Date Value Ref Range Status  11/07/2023 49 >39 mg/dL Final   Triglycerides  Date Value Ref Range Status  11/07/2023 128 0 - 149 mg/dL Final         Passed - Cr in normal range and within 360 days    Creatinine  Date Value Ref Range Status  09/27/2014 0.73 0.60 - 1.30 mg/dL Final   Creatinine, Ser  Date Value Ref Range Status  11/07/2023 0.74 0.57 - 1.00 mg/dL Final         Passed - Patient is not pregnant

## 2024-12-01 ENCOUNTER — Other Ambulatory Visit: Payer: Self-pay | Admitting: Nurse Practitioner

## 2024-12-03 NOTE — Telephone Encounter (Signed)
 Unable to refill per protocol, appointment needed.   Requested Prescriptions  Pending Prescriptions Disp Refills   valsartan  (DIOVAN ) 80 MG tablet [Pharmacy Med Name: VALSARTAN  80 MG TABLET] 90 tablet 1    Sig: TAKE 1 TABLET BY MOUTH EVERY DAY     Cardiovascular:  Angiotensin Receptor Blockers Failed - 12/03/2024  4:14 PM      Failed - Cr in normal range and within 180 days    Creatinine  Date Value Ref Range Status  09/27/2014 0.73 0.60 - 1.30 mg/dL Final   Creatinine, Ser  Date Value Ref Range Status  11/07/2023 0.74 0.57 - 1.00 mg/dL Final         Failed - K in normal range and within 180 days    Potassium  Date Value Ref Range Status  11/07/2023 4.6 3.5 - 5.2 mmol/L Final  09/27/2014 3.4 (L) 3.5 - 5.1 mmol/L Final         Failed - Valid encounter within last 6 months    Recent Outpatient Visits   None            Passed - Patient is not pregnant      Passed - Last BP in normal range    BP Readings from Last 1 Encounters:  11/15/23 133/87
